# Patient Record
Sex: Female | Born: 1960 | Race: White | Hispanic: No | Marital: Single | Smoking: Never smoker
Health system: Southern US, Community
[De-identification: ages and names within clinical notes are randomized; demographics above are authoritative.]

## PROBLEM LIST (undated history)

## (undated) DIAGNOSIS — N289 Disorder of kidney and ureter, unspecified: Secondary | ICD-10-CM

## (undated) HISTORY — PX: APPENDECTOMY: SHX54

---

## 1997-03-20 ENCOUNTER — Encounter: Admission: RE | Admit: 1997-03-20 | Discharge: 1997-06-18 | Payer: Self-pay | Admitting: Obstetrics & Gynecology

## 1997-07-06 ENCOUNTER — Encounter: Admission: RE | Admit: 1997-07-06 | Discharge: 1997-10-04 | Payer: Self-pay | Admitting: *Deleted

## 1997-08-08 ENCOUNTER — Other Ambulatory Visit: Admission: RE | Admit: 1997-08-08 | Discharge: 1997-08-08 | Payer: Self-pay | Admitting: Obstetrics & Gynecology

## 1998-02-09 ENCOUNTER — Observation Stay (HOSPITAL_COMMUNITY): Admission: AD | Admit: 1998-02-09 | Discharge: 1998-02-10 | Payer: Self-pay | Admitting: Obstetrics and Gynecology

## 1998-02-09 ENCOUNTER — Encounter: Payer: Self-pay | Admitting: Obstetrics and Gynecology

## 1998-02-22 ENCOUNTER — Inpatient Hospital Stay (HOSPITAL_COMMUNITY): Admission: AD | Admit: 1998-02-22 | Discharge: 1998-03-10 | Payer: Self-pay | Admitting: Obstetrics and Gynecology

## 1998-02-26 ENCOUNTER — Encounter: Payer: Self-pay | Admitting: Obstetrics and Gynecology

## 1998-03-01 ENCOUNTER — Encounter: Payer: Self-pay | Admitting: Obstetrics and Gynecology

## 1998-03-03 ENCOUNTER — Encounter: Payer: Self-pay | Admitting: Obstetrics and Gynecology

## 1998-03-05 ENCOUNTER — Encounter: Payer: Self-pay | Admitting: Obstetrics and Gynecology

## 1998-03-09 ENCOUNTER — Encounter: Payer: Self-pay | Admitting: Obstetrics and Gynecology

## 1998-04-15 ENCOUNTER — Encounter: Payer: Self-pay | Admitting: Obstetrics and Gynecology

## 1998-04-15 ENCOUNTER — Inpatient Hospital Stay (HOSPITAL_COMMUNITY): Admission: AD | Admit: 1998-04-15 | Discharge: 1998-04-15 | Payer: Self-pay | Admitting: Obstetrics and Gynecology

## 1998-07-07 ENCOUNTER — Inpatient Hospital Stay (HOSPITAL_COMMUNITY): Admission: AD | Admit: 1998-07-07 | Discharge: 1998-07-09 | Payer: Self-pay | Admitting: Obstetrics and Gynecology

## 1998-08-28 ENCOUNTER — Other Ambulatory Visit: Admission: RE | Admit: 1998-08-28 | Discharge: 1998-08-28 | Payer: Self-pay | Admitting: Obstetrics & Gynecology

## 1999-05-15 ENCOUNTER — Other Ambulatory Visit: Admission: RE | Admit: 1999-05-15 | Discharge: 1999-05-15 | Payer: Self-pay | Admitting: Obstetrics & Gynecology

## 1999-05-16 ENCOUNTER — Ambulatory Visit (HOSPITAL_COMMUNITY): Admission: RE | Admit: 1999-05-16 | Discharge: 1999-05-16 | Payer: Self-pay | Admitting: Obstetrics & Gynecology

## 2000-01-16 ENCOUNTER — Ambulatory Visit (HOSPITAL_COMMUNITY): Admission: AD | Admit: 2000-01-16 | Discharge: 2000-01-16 | Payer: Self-pay | Admitting: Obstetrics & Gynecology

## 2000-04-10 ENCOUNTER — Other Ambulatory Visit: Admission: RE | Admit: 2000-04-10 | Discharge: 2000-04-10 | Payer: Self-pay | Admitting: Family Medicine

## 2000-04-10 ENCOUNTER — Other Ambulatory Visit: Admission: RE | Admit: 2000-04-10 | Discharge: 2000-04-10 | Payer: Self-pay | Admitting: Obstetrics & Gynecology

## 2000-10-15 ENCOUNTER — Inpatient Hospital Stay (HOSPITAL_COMMUNITY): Admission: AD | Admit: 2000-10-15 | Discharge: 2000-10-17 | Payer: Self-pay | Admitting: Obstetrics & Gynecology

## 2000-11-18 ENCOUNTER — Other Ambulatory Visit: Admission: RE | Admit: 2000-11-18 | Discharge: 2000-11-18 | Payer: Self-pay | Admitting: Obstetrics & Gynecology

## 2001-11-23 ENCOUNTER — Other Ambulatory Visit: Admission: RE | Admit: 2001-11-23 | Discharge: 2001-11-23 | Payer: Self-pay | Admitting: Obstetrics & Gynecology

## 2003-01-19 ENCOUNTER — Other Ambulatory Visit: Admission: RE | Admit: 2003-01-19 | Discharge: 2003-01-19 | Payer: Self-pay | Admitting: Obstetrics & Gynecology

## 2003-04-20 ENCOUNTER — Other Ambulatory Visit: Admission: RE | Admit: 2003-04-20 | Discharge: 2003-04-20 | Payer: Self-pay | Admitting: Obstetrics & Gynecology

## 2003-11-07 ENCOUNTER — Other Ambulatory Visit: Admission: RE | Admit: 2003-11-07 | Discharge: 2003-11-07 | Payer: Self-pay | Admitting: Obstetrics & Gynecology

## 2004-04-10 ENCOUNTER — Other Ambulatory Visit: Admission: RE | Admit: 2004-04-10 | Discharge: 2004-04-10 | Payer: Self-pay | Admitting: Obstetrics & Gynecology

## 2005-04-23 ENCOUNTER — Other Ambulatory Visit: Admission: RE | Admit: 2005-04-23 | Discharge: 2005-04-23 | Payer: Self-pay | Admitting: Obstetrics & Gynecology

## 2006-06-04 ENCOUNTER — Encounter: Admission: RE | Admit: 2006-06-04 | Discharge: 2006-06-04 | Payer: Self-pay | Admitting: Obstetrics & Gynecology

## 2012-03-25 ENCOUNTER — Emergency Department (HOSPITAL_COMMUNITY): Payer: BC Managed Care – PPO

## 2012-03-25 ENCOUNTER — Encounter (HOSPITAL_COMMUNITY): Payer: Self-pay | Admitting: *Deleted

## 2012-03-25 ENCOUNTER — Emergency Department (HOSPITAL_COMMUNITY)
Admission: EM | Admit: 2012-03-25 | Discharge: 2012-03-25 | Disposition: A | Discharge status: Home / Self Care | Payer: BC Managed Care – PPO | Attending: Emergency Medicine | Admitting: Emergency Medicine

## 2012-03-25 DIAGNOSIS — N200 Calculus of kidney: Secondary | ICD-10-CM

## 2012-03-25 DIAGNOSIS — R350 Frequency of micturition: Secondary | ICD-10-CM | POA: Insufficient documentation

## 2012-03-25 DIAGNOSIS — R109 Unspecified abdominal pain: Secondary | ICD-10-CM | POA: Insufficient documentation

## 2012-03-25 DIAGNOSIS — Z79899 Other long term (current) drug therapy: Secondary | ICD-10-CM | POA: Insufficient documentation

## 2012-03-25 DIAGNOSIS — Z7982 Long term (current) use of aspirin: Secondary | ICD-10-CM | POA: Insufficient documentation

## 2012-03-25 DIAGNOSIS — R112 Nausea with vomiting, unspecified: Secondary | ICD-10-CM | POA: Insufficient documentation

## 2012-03-25 HISTORY — DX: Disorder of kidney and ureter, unspecified: N28.9

## 2012-03-25 LAB — URINALYSIS, ROUTINE W REFLEX MICROSCOPIC
Ketones, ur: NEGATIVE mg/dL
Nitrite: NEGATIVE
Protein, ur: 30 mg/dL — AB
Urobilinogen, UA: 1 mg/dL (ref 0.0–1.0)

## 2012-03-25 MED ORDER — HYDROMORPHONE HCL PF 1 MG/ML IJ SOLN: 1.0000 mg | Freq: Once | INTRAMUSCULAR | Status: DC

## 2012-03-25 MED ORDER — ONDANSETRON HCL 4 MG/2ML IJ SOLN
4.0000 mg | Freq: Once | INTRAMUSCULAR | Status: AC
  Administered 2012-03-25: 4 mg via INTRAVENOUS
  Filled 2012-03-25: qty 2

## 2012-03-25 MED ORDER — FENTANYL CITRATE 0.05 MG/ML IJ SOLN
50.0000 ug | Freq: Once | INTRAMUSCULAR | Status: AC
  Administered 2012-03-25: 50 ug via INTRAVENOUS
  Filled 2012-03-25: qty 2

## 2012-03-25 MED ORDER — HYDROCODONE-ACETAMINOPHEN 5-325 MG PO TABS: ORAL_TABLET | ORAL | Status: DC

## 2012-03-25 MED ORDER — ONDANSETRON HCL 4 MG/2ML IJ SOLN: 4.0000 mg | Freq: Once | INTRAMUSCULAR | Status: DC

## 2012-03-25 NOTE — ED Provider Notes (Signed)
History     CSN: [id number]  Arrival date & time 03/25/12  1746   First MD Initiated Contact with Patient 03/25/12 1847      Chief Complaint  Patient presents with  . Nephrolithiasis    (Consider location/radiation/quality/duration/timing/severity/associated sxs/prior treatment) HPI  Patient states last night she started having frequency of urination and then about 4:30 this afternoon she started having left flank pain that has gotten very intense. She has had nausea and vomiting. She denies hematuria or fever. She states her current pain is a 10 out of 10. She states nothing she does makes the pain worse, nothing she does makes the pain feel better. She states she had similar pain about 7 years ago when she passed a kidney stone which she is been having since she was 52 years old. She states she called the urology office and spoke to Dr. Isabel Caprice who told her to come to the emergency room. She states she's never had to have any type of procedure to help her pass her stone.  PCP Dr. Martha Clan  Past Medical History  Diagnosis Date  . Renal disorder     kidney stone    History reviewed. No pertinent past surgical history.  History reviewed. No pertinent family history.  History  Substance Use Topics  . Smoking status: Never Smoker   . Smokeless tobacco: Not on file  . Alcohol Use: No  Lives at home Lives with spouse employed  OB History    Grav Para Term Preterm Abortions TAB SAB Ect Mult Living                  Review of Systems  All other systems reviewed and are negative.    Allergies  Sulfur  Home Medications   Current Outpatient Rx  Name  Route  Sig  Dispense  Refill  . ASPIRIN 81 MG PO TABS   Oral   Take 81 mg by mouth daily.         . ADULT MULTIVITAMIN W/MINERALS CH   Oral   Take 1 tablet by mouth daily.         Marland Kitchen NORGESTREL-ETHINYL ESTRADIOL 0.3-30 MG-MCG PO TABS   Oral   Take 1 tablet by mouth daily.           BP 118/40  Pulse 68   Temp 98.3 F (36.8 C) (Oral)  Resp 16  SpO2 100%  Vital signs normal    Physical Exam  Nursing note and vitals reviewed. Constitutional: She is oriented to person, place, and time. She appears well-developed and well-nourished.  Non-toxic appearance. She does not appear ill. She appears distressed.  HENT:  Head: Normocephalic and atraumatic.  Right Ear: External ear normal.  Left Ear: External ear normal.  Nose: Nose normal. No mucosal edema or rhinorrhea.  Mouth/Throat: Oropharynx is clear and moist and mucous membranes are normal. No dental abscesses or uvula swelling.  Eyes: Conjunctivae normal and EOM are normal. Pupils are equal, round, and reactive to light.  Neck: Normal range of motion and full passive range of motion without pain. Neck supple.  Cardiovascular: Normal rate, regular rhythm and normal heart sounds.  Exam reveals no gallop and no friction rub.   No murmur heard. Pulmonary/Chest: Effort normal and breath sounds normal. No respiratory distress. She has no wheezes. She has no rhonchi. She has no rales. She exhibits no tenderness and no crepitus.  Abdominal: Soft. Normal appearance and bowel sounds are normal. She exhibits no  distension. There is no tenderness. There is no rebound and no guarding.  Genitourinary:       Mild left flank tenderness to palpation  Musculoskeletal: Normal range of motion. She exhibits no edema and no tenderness.       Moves all extremities well.   Neurological: She is alert and oriented to person, place, and time. She has normal strength. No cranial nerve deficit.  Skin: Skin is warm, dry and intact. No rash noted. No erythema. No pallor.  Psychiatric: She has a normal mood and affect. Her speech is normal and behavior is normal. Her mood appears not anxious.    ED Course  Procedures (including critical care time)   Medications  ondansetron (ZOFRAN) injection 4 mg (not administered)  HYDROmorphone (DILAUDID) injection 1 mg (not  administered)  ondansetron (ZOFRAN) injection 4 mg (4 mg Intravenous Given 03/25/12 1906)  fentaNYL (SUBLIMAZE) injection 50 mcg (50 mcg Intravenous Given 03/25/12 1909)   Patient states her pain is basically gone, she has mild soreness in her left flank now. We discussed her CT results including that she still has bilateral renal stones, however she has already passed the stone that she was passing tonight.  Results for orders placed during the hospital encounter of 03/25/12  URINALYSIS, ROUTINE W REFLEX MICROSCOPIC      Component Value Range   Color, Urine YELLOW  YELLOW   APPearance CLOUDY (*) CLEAR   Specific Gravity, Urine 1.021  1.005 - 1.030   pH 8.0  5.0 - 8.0   Glucose, UA NEGATIVE  NEGATIVE mg/dL   Hgb urine dipstick MODERATE (*) NEGATIVE   Bilirubin Urine NEGATIVE  NEGATIVE   Ketones, ur NEGATIVE  NEGATIVE mg/dL   Protein, ur 30 (*) NEGATIVE mg/dL   Urobilinogen, UA 1.0  0.0 - 1.0 mg/dL   Nitrite NEGATIVE  NEGATIVE   Leukocytes, UA SMALL (*) NEGATIVE  URINE MICROSCOPIC-ADD ON      Component Value Range   Squamous Epithelial / LPF FEW (*) RARE   WBC, UA 0-2  <3 WBC/hpf   RBC / HPF 21-50  <3 RBC/hpf   Urine-Other AMORPHOUS URATES/PHOSPHATES     Laboratory interpretation all normal except hematuria   Ct Abdomen Pelvis Wo Contrast  03/25/2012  *RADIOLOGY REPORT*  Clinical Data: Left-sided flank pain, history of renal stones  CT ABDOMEN AND PELVIS WITHOUT CONTRAST  Technique:  Multidetector CT imaging of the abdomen and pelvis was performed following the standard protocol without intravenous contrast.  Comparison: None.  Findings:  The lack of intravenous contrast limits the ability to evaluate solid abdominal organs.  There is an approximately 5 mm opacity lying within the dependent portion of the midline in the urinary bladder (image 64, series 2). This finding is associated with asymmetric left-sided residual ureterectasis and very mild pelvocaliectasis and is favored to represent  a recently passed a left-sided renal stone.  Multiple additional nonobstructing stones are seen bilaterally. Dominant nonobstructing stone within the inferior pole of the left kidney measures approximately 5 x 3 mm (image 29, series 2) while adjacent approximately 2 and 3 mm stones are also seen within the inferior pole left kidney.  There are two punctate (2-3 mm) nonobstructing stones within the mid aspect of the right kidney (images 21 and 25).  No right-sided urinary obstruction no perinephric stranding.  Normal noncontrast appearance of the bilateral adrenal glands, pancreas and spleen.  Colonic diverticulosis without evidence of diverticulitis.  The transverse colon is noted to be redundant and low-lying.  Normal appearance of the appendix.  No evidence of enteric obstruction. No pneumoperitoneum, pneumatosis or portal venous gas.  Normal caliber of the abdominal aorta.  No definite retroperitoneal, mesenteric, pelvic or inguinal lymphadenopathy on this noncontrast examination.  Normal noncontrast appearance of the pelvic organs.  No discrete adnexal lesion.  No free fluid in the pelvis.  Limited visualization of the lower thorax demonstrates minimal left basilar subsegmental ground-glass atelectasis.  No focal airspace opacity.  No pleural effusion  Normal heart size.  No pericardial effusion.  No acute or aggressive osseous abnormalities.  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  IMPRESSION: 1.  Recently passed approximally 5 mm left sided renal stone is seen within the urinary bladder with associated mild residual asymmetric left-sided ureterectasis and pelvocaliectasis. 2.  Bilateral nonobstructing renal stone burden as above, left greater than right. 3.  Colonic diverticulosis without evidence of diverticulitis.  4.  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   Original Report Authenticated By: Tacey Ruiz, MD      1. Renal stone      New Prescriptions   HYDROCODONE-ACETAMINOPHEN (NORCO/VICODIN) 5-325 MG PER TABLET    Take 1 or 2 po Q 6hrs for pain    Plan discharge  Devoria Albe, MD, Armando Gang   MDM          Ward Givens, MD 03/25/12 2255

## 2012-03-25 NOTE — ED Notes (Signed)
Pt reports hx of kidney stones. Pt reports dysuria and left sided flank pain. Pt hunched over in pain. Pain started this afternoon 9/10.

## 2012-03-25 NOTE — ED Notes (Signed)
Patient transported to CT 

## 2012-04-20 ENCOUNTER — Other Ambulatory Visit: Payer: Self-pay | Admitting: Obstetrics & Gynecology

## 2012-04-30 ENCOUNTER — Ambulatory Visit
Admission: RE | Admit: 2012-04-30 | Discharge: 2012-04-30 | Disposition: A | Discharge status: Home / Self Care | Payer: BC Managed Care – PPO | Source: Ambulatory Visit | Attending: Obstetrics & Gynecology | Admitting: Obstetrics & Gynecology

## 2015-05-23 DIAGNOSIS — L821 Other seborrheic keratosis: Secondary | ICD-10-CM | POA: Diagnosis not present

## 2015-05-23 DIAGNOSIS — L570 Actinic keratosis: Secondary | ICD-10-CM | POA: Diagnosis not present

## 2015-05-23 DIAGNOSIS — D225 Melanocytic nevi of trunk: Secondary | ICD-10-CM | POA: Diagnosis not present

## 2015-05-23 DIAGNOSIS — L814 Other melanin hyperpigmentation: Secondary | ICD-10-CM | POA: Diagnosis not present

## 2015-06-26 DIAGNOSIS — H11002 Unspecified pterygium of left eye: Secondary | ICD-10-CM | POA: Diagnosis not present

## 2015-06-26 DIAGNOSIS — H18452 Nodular corneal degeneration, left eye: Secondary | ICD-10-CM | POA: Diagnosis not present

## 2015-06-26 DIAGNOSIS — H18451 Nodular corneal degeneration, right eye: Secondary | ICD-10-CM | POA: Diagnosis not present

## 2015-10-03 DIAGNOSIS — Z01419 Encounter for gynecological examination (general) (routine) without abnormal findings: Secondary | ICD-10-CM | POA: Diagnosis not present

## 2015-10-03 DIAGNOSIS — Z1231 Encounter for screening mammogram for malignant neoplasm of breast: Secondary | ICD-10-CM | POA: Diagnosis not present

## 2015-10-03 DIAGNOSIS — Z6824 Body mass index (BMI) 24.0-24.9, adult: Secondary | ICD-10-CM | POA: Diagnosis not present

## 2016-01-02 DIAGNOSIS — R8299 Other abnormal findings in urine: Secondary | ICD-10-CM | POA: Diagnosis not present

## 2016-01-02 DIAGNOSIS — E784 Other hyperlipidemia: Secondary | ICD-10-CM | POA: Diagnosis not present

## 2016-01-02 DIAGNOSIS — M859 Disorder of bone density and structure, unspecified: Secondary | ICD-10-CM | POA: Diagnosis not present

## 2016-01-09 DIAGNOSIS — R5383 Other fatigue: Secondary | ICD-10-CM | POA: Diagnosis not present

## 2016-01-09 DIAGNOSIS — Z0000 Encounter for general adult medical examination without abnormal findings: Secondary | ICD-10-CM | POA: Diagnosis not present

## 2016-01-09 DIAGNOSIS — E784 Other hyperlipidemia: Secondary | ICD-10-CM | POA: Diagnosis not present

## 2016-01-09 DIAGNOSIS — Z8249 Family history of ischemic heart disease and other diseases of the circulatory system: Secondary | ICD-10-CM | POA: Diagnosis not present

## 2016-01-09 DIAGNOSIS — Z1389 Encounter for screening for other disorder: Secondary | ICD-10-CM | POA: Diagnosis not present

## 2016-01-09 DIAGNOSIS — M859 Disorder of bone density and structure, unspecified: Secondary | ICD-10-CM | POA: Diagnosis not present

## 2016-01-25 DIAGNOSIS — Z1212 Encounter for screening for malignant neoplasm of rectum: Secondary | ICD-10-CM | POA: Diagnosis not present

## 2016-03-28 DIAGNOSIS — J111 Influenza due to unidentified influenza virus with other respiratory manifestations: Secondary | ICD-10-CM | POA: Diagnosis not present

## 2016-05-13 DIAGNOSIS — L821 Other seborrheic keratosis: Secondary | ICD-10-CM | POA: Diagnosis not present

## 2016-05-13 DIAGNOSIS — L570 Actinic keratosis: Secondary | ICD-10-CM | POA: Diagnosis not present

## 2016-05-13 DIAGNOSIS — L814 Other melanin hyperpigmentation: Secondary | ICD-10-CM | POA: Diagnosis not present

## 2016-06-25 DIAGNOSIS — H18451 Nodular corneal degeneration, right eye: Secondary | ICD-10-CM | POA: Diagnosis not present

## 2016-06-25 DIAGNOSIS — H18452 Nodular corneal degeneration, left eye: Secondary | ICD-10-CM | POA: Diagnosis not present

## 2016-06-25 DIAGNOSIS — H11002 Unspecified pterygium of left eye: Secondary | ICD-10-CM | POA: Diagnosis not present

## 2016-10-09 DIAGNOSIS — R87610 Atypical squamous cells of undetermined significance on cytologic smear of cervix (ASC-US): Secondary | ICD-10-CM | POA: Diagnosis not present

## 2016-10-09 DIAGNOSIS — Z13820 Encounter for screening for osteoporosis: Secondary | ICD-10-CM | POA: Diagnosis not present

## 2016-10-09 DIAGNOSIS — Z6820 Body mass index (BMI) 20.0-20.9, adult: Secondary | ICD-10-CM | POA: Diagnosis not present

## 2016-10-09 DIAGNOSIS — Z1231 Encounter for screening mammogram for malignant neoplasm of breast: Secondary | ICD-10-CM | POA: Diagnosis not present

## 2016-10-09 DIAGNOSIS — Z01419 Encounter for gynecological examination (general) (routine) without abnormal findings: Secondary | ICD-10-CM | POA: Diagnosis not present

## 2017-02-03 DIAGNOSIS — H903 Sensorineural hearing loss, bilateral: Secondary | ICD-10-CM | POA: Diagnosis not present

## 2017-02-19 DIAGNOSIS — G589 Mononeuropathy, unspecified: Secondary | ICD-10-CM | POA: Diagnosis not present

## 2017-02-19 DIAGNOSIS — H9193 Unspecified hearing loss, bilateral: Secondary | ICD-10-CM | POA: Diagnosis not present

## 2017-02-19 DIAGNOSIS — H9319 Tinnitus, unspecified ear: Secondary | ICD-10-CM | POA: Diagnosis not present

## 2017-02-19 DIAGNOSIS — Z6822 Body mass index (BMI) 22.0-22.9, adult: Secondary | ICD-10-CM | POA: Diagnosis not present

## 2017-03-17 DIAGNOSIS — H9313 Tinnitus, bilateral: Secondary | ICD-10-CM | POA: Diagnosis not present

## 2017-03-17 DIAGNOSIS — H903 Sensorineural hearing loss, bilateral: Secondary | ICD-10-CM | POA: Diagnosis not present

## 2017-03-25 DIAGNOSIS — N950 Postmenopausal bleeding: Secondary | ICD-10-CM | POA: Diagnosis not present

## 2017-04-08 DIAGNOSIS — R87615 Unsatisfactory cytologic smear of cervix: Secondary | ICD-10-CM | POA: Diagnosis not present

## 2017-04-08 DIAGNOSIS — N950 Postmenopausal bleeding: Secondary | ICD-10-CM | POA: Diagnosis not present

## 2017-04-08 DIAGNOSIS — R87610 Atypical squamous cells of undetermined significance on cytologic smear of cervix (ASC-US): Secondary | ICD-10-CM | POA: Diagnosis not present

## 2017-04-22 DIAGNOSIS — N950 Postmenopausal bleeding: Secondary | ICD-10-CM | POA: Diagnosis not present

## 2017-05-12 DIAGNOSIS — L814 Other melanin hyperpigmentation: Secondary | ICD-10-CM | POA: Diagnosis not present

## 2017-05-12 DIAGNOSIS — L738 Other specified follicular disorders: Secondary | ICD-10-CM | POA: Diagnosis not present

## 2017-05-12 DIAGNOSIS — L821 Other seborrheic keratosis: Secondary | ICD-10-CM | POA: Diagnosis not present

## 2017-05-12 DIAGNOSIS — L570 Actinic keratosis: Secondary | ICD-10-CM | POA: Diagnosis not present

## 2017-07-07 DIAGNOSIS — H11002 Unspecified pterygium of left eye: Secondary | ICD-10-CM | POA: Diagnosis not present

## 2017-07-07 DIAGNOSIS — H18452 Nodular corneal degeneration, left eye: Secondary | ICD-10-CM | POA: Diagnosis not present

## 2017-07-07 DIAGNOSIS — H11051 Peripheral pterygium, progressive, right eye: Secondary | ICD-10-CM | POA: Diagnosis not present

## 2017-07-07 DIAGNOSIS — Z9889 Other specified postprocedural states: Secondary | ICD-10-CM | POA: Diagnosis not present

## 2017-09-15 DIAGNOSIS — H903 Sensorineural hearing loss, bilateral: Secondary | ICD-10-CM | POA: Diagnosis not present

## 2017-09-15 DIAGNOSIS — H9313 Tinnitus, bilateral: Secondary | ICD-10-CM | POA: Diagnosis not present

## 2017-09-22 DIAGNOSIS — M859 Disorder of bone density and structure, unspecified: Secondary | ICD-10-CM | POA: Diagnosis not present

## 2017-09-22 DIAGNOSIS — R82998 Other abnormal findings in urine: Secondary | ICD-10-CM | POA: Diagnosis not present

## 2017-09-22 DIAGNOSIS — M8580 Other specified disorders of bone density and structure, unspecified site: Secondary | ICD-10-CM | POA: Diagnosis not present

## 2017-09-22 DIAGNOSIS — E7849 Other hyperlipidemia: Secondary | ICD-10-CM | POA: Diagnosis not present

## 2017-09-29 DIAGNOSIS — Z1389 Encounter for screening for other disorder: Secondary | ICD-10-CM | POA: Diagnosis not present

## 2017-09-29 DIAGNOSIS — H9319 Tinnitus, unspecified ear: Secondary | ICD-10-CM | POA: Diagnosis not present

## 2017-09-29 DIAGNOSIS — Z0000 Encounter for general adult medical examination without abnormal findings: Secondary | ICD-10-CM | POA: Diagnosis not present

## 2017-09-29 DIAGNOSIS — R5383 Other fatigue: Secondary | ICD-10-CM | POA: Diagnosis not present

## 2017-09-29 DIAGNOSIS — E7849 Other hyperlipidemia: Secondary | ICD-10-CM | POA: Diagnosis not present

## 2017-09-29 DIAGNOSIS — M8580 Other specified disorders of bone density and structure, unspecified site: Secondary | ICD-10-CM | POA: Diagnosis not present

## 2017-10-01 ENCOUNTER — Other Ambulatory Visit: Payer: Self-pay | Admitting: Internal Medicine

## 2017-10-01 DIAGNOSIS — Z8249 Family history of ischemic heart disease and other diseases of the circulatory system: Secondary | ICD-10-CM

## 2017-10-01 DIAGNOSIS — E785 Hyperlipidemia, unspecified: Secondary | ICD-10-CM

## 2017-10-02 DIAGNOSIS — Z1212 Encounter for screening for malignant neoplasm of rectum: Secondary | ICD-10-CM | POA: Diagnosis not present

## 2017-10-08 ENCOUNTER — Ambulatory Visit
Admission: RE | Admit: 2017-10-08 | Discharge: 2017-10-08 | Disposition: A | Discharge status: Home / Self Care | Payer: No Typology Code available for payment source | Source: Ambulatory Visit | Attending: Internal Medicine | Admitting: Internal Medicine

## 2017-10-08 DIAGNOSIS — Z8249 Family history of ischemic heart disease and other diseases of the circulatory system: Secondary | ICD-10-CM

## 2017-10-08 DIAGNOSIS — E785 Hyperlipidemia, unspecified: Secondary | ICD-10-CM

## 2017-10-15 DIAGNOSIS — Z01419 Encounter for gynecological examination (general) (routine) without abnormal findings: Secondary | ICD-10-CM | POA: Diagnosis not present

## 2017-10-15 DIAGNOSIS — Z1231 Encounter for screening mammogram for malignant neoplasm of breast: Secondary | ICD-10-CM | POA: Diagnosis not present

## 2017-10-15 DIAGNOSIS — Z6822 Body mass index (BMI) 22.0-22.9, adult: Secondary | ICD-10-CM | POA: Diagnosis not present

## 2018-05-17 DIAGNOSIS — R87610 Atypical squamous cells of undetermined significance on cytologic smear of cervix (ASC-US): Secondary | ICD-10-CM | POA: Diagnosis not present

## 2018-05-18 DIAGNOSIS — D225 Melanocytic nevi of trunk: Secondary | ICD-10-CM | POA: Diagnosis not present

## 2018-05-18 DIAGNOSIS — L821 Other seborrheic keratosis: Secondary | ICD-10-CM | POA: Diagnosis not present

## 2018-05-18 DIAGNOSIS — D2261 Melanocytic nevi of right upper limb, including shoulder: Secondary | ICD-10-CM | POA: Diagnosis not present

## 2018-05-18 DIAGNOSIS — L570 Actinic keratosis: Secondary | ICD-10-CM | POA: Diagnosis not present

## 2018-06-22 DIAGNOSIS — N950 Postmenopausal bleeding: Secondary | ICD-10-CM | POA: Diagnosis not present

## 2018-09-08 DIAGNOSIS — H18452 Nodular corneal degeneration, left eye: Secondary | ICD-10-CM | POA: Diagnosis not present

## 2018-09-08 DIAGNOSIS — H11051 Peripheral pterygium, progressive, right eye: Secondary | ICD-10-CM | POA: Diagnosis not present

## 2018-09-08 DIAGNOSIS — H11002 Unspecified pterygium of left eye: Secondary | ICD-10-CM | POA: Diagnosis not present

## 2018-09-15 DIAGNOSIS — H903 Sensorineural hearing loss, bilateral: Secondary | ICD-10-CM | POA: Diagnosis not present

## 2018-09-15 DIAGNOSIS — H9313 Tinnitus, bilateral: Secondary | ICD-10-CM | POA: Diagnosis not present

## 2018-10-19 DIAGNOSIS — Z01419 Encounter for gynecological examination (general) (routine) without abnormal findings: Secondary | ICD-10-CM | POA: Diagnosis not present

## 2018-10-19 DIAGNOSIS — Z6822 Body mass index (BMI) 22.0-22.9, adult: Secondary | ICD-10-CM | POA: Diagnosis not present

## 2018-10-19 DIAGNOSIS — Z1231 Encounter for screening mammogram for malignant neoplasm of breast: Secondary | ICD-10-CM | POA: Diagnosis not present

## 2019-01-21 DIAGNOSIS — R350 Frequency of micturition: Secondary | ICD-10-CM | POA: Diagnosis not present

## 2019-01-21 DIAGNOSIS — R300 Dysuria: Secondary | ICD-10-CM | POA: Diagnosis not present

## 2019-01-21 DIAGNOSIS — R102 Pelvic and perineal pain: Secondary | ICD-10-CM | POA: Diagnosis not present

## 2019-01-21 DIAGNOSIS — R109 Unspecified abdominal pain: Secondary | ICD-10-CM | POA: Diagnosis not present

## 2019-01-27 DIAGNOSIS — R102 Pelvic and perineal pain: Secondary | ICD-10-CM | POA: Diagnosis not present

## 2019-03-02 DIAGNOSIS — L308 Other specified dermatitis: Secondary | ICD-10-CM | POA: Diagnosis not present

## 2019-03-02 DIAGNOSIS — L570 Actinic keratosis: Secondary | ICD-10-CM | POA: Diagnosis not present

## 2019-04-14 DIAGNOSIS — M9901 Segmental and somatic dysfunction of cervical region: Secondary | ICD-10-CM | POA: Diagnosis not present

## 2019-04-14 DIAGNOSIS — M50322 Other cervical disc degeneration at C5-C6 level: Secondary | ICD-10-CM | POA: Diagnosis not present

## 2019-04-14 DIAGNOSIS — M9905 Segmental and somatic dysfunction of pelvic region: Secondary | ICD-10-CM | POA: Diagnosis not present

## 2019-04-14 DIAGNOSIS — Q72812 Congenital shortening of left lower limb: Secondary | ICD-10-CM | POA: Diagnosis not present

## 2019-04-18 DIAGNOSIS — M9905 Segmental and somatic dysfunction of pelvic region: Secondary | ICD-10-CM | POA: Diagnosis not present

## 2019-04-18 DIAGNOSIS — Q72812 Congenital shortening of left lower limb: Secondary | ICD-10-CM | POA: Diagnosis not present

## 2019-04-18 DIAGNOSIS — M9901 Segmental and somatic dysfunction of cervical region: Secondary | ICD-10-CM | POA: Diagnosis not present

## 2019-04-18 DIAGNOSIS — M50322 Other cervical disc degeneration at C5-C6 level: Secondary | ICD-10-CM | POA: Diagnosis not present

## 2019-04-19 DIAGNOSIS — Q72812 Congenital shortening of left lower limb: Secondary | ICD-10-CM | POA: Diagnosis not present

## 2019-04-19 DIAGNOSIS — M9905 Segmental and somatic dysfunction of pelvic region: Secondary | ICD-10-CM | POA: Diagnosis not present

## 2019-04-19 DIAGNOSIS — M9901 Segmental and somatic dysfunction of cervical region: Secondary | ICD-10-CM | POA: Diagnosis not present

## 2019-04-19 DIAGNOSIS — M50322 Other cervical disc degeneration at C5-C6 level: Secondary | ICD-10-CM | POA: Diagnosis not present

## 2019-04-21 DIAGNOSIS — M9905 Segmental and somatic dysfunction of pelvic region: Secondary | ICD-10-CM | POA: Diagnosis not present

## 2019-04-21 DIAGNOSIS — M50322 Other cervical disc degeneration at C5-C6 level: Secondary | ICD-10-CM | POA: Diagnosis not present

## 2019-04-21 DIAGNOSIS — Q72812 Congenital shortening of left lower limb: Secondary | ICD-10-CM | POA: Diagnosis not present

## 2019-04-21 DIAGNOSIS — M9901 Segmental and somatic dysfunction of cervical region: Secondary | ICD-10-CM | POA: Diagnosis not present

## 2019-04-25 DIAGNOSIS — M9905 Segmental and somatic dysfunction of pelvic region: Secondary | ICD-10-CM | POA: Diagnosis not present

## 2019-04-25 DIAGNOSIS — Q72812 Congenital shortening of left lower limb: Secondary | ICD-10-CM | POA: Diagnosis not present

## 2019-04-25 DIAGNOSIS — M9901 Segmental and somatic dysfunction of cervical region: Secondary | ICD-10-CM | POA: Diagnosis not present

## 2019-04-25 DIAGNOSIS — M50322 Other cervical disc degeneration at C5-C6 level: Secondary | ICD-10-CM | POA: Diagnosis not present

## 2019-04-26 DIAGNOSIS — Q72812 Congenital shortening of left lower limb: Secondary | ICD-10-CM | POA: Diagnosis not present

## 2019-04-26 DIAGNOSIS — M9905 Segmental and somatic dysfunction of pelvic region: Secondary | ICD-10-CM | POA: Diagnosis not present

## 2019-04-26 DIAGNOSIS — M9901 Segmental and somatic dysfunction of cervical region: Secondary | ICD-10-CM | POA: Diagnosis not present

## 2019-04-26 DIAGNOSIS — M50322 Other cervical disc degeneration at C5-C6 level: Secondary | ICD-10-CM | POA: Diagnosis not present

## 2019-04-28 DIAGNOSIS — Q72812 Congenital shortening of left lower limb: Secondary | ICD-10-CM | POA: Diagnosis not present

## 2019-04-28 DIAGNOSIS — M50322 Other cervical disc degeneration at C5-C6 level: Secondary | ICD-10-CM | POA: Diagnosis not present

## 2019-04-28 DIAGNOSIS — M9905 Segmental and somatic dysfunction of pelvic region: Secondary | ICD-10-CM | POA: Diagnosis not present

## 2019-04-28 DIAGNOSIS — M9901 Segmental and somatic dysfunction of cervical region: Secondary | ICD-10-CM | POA: Diagnosis not present

## 2019-05-02 DIAGNOSIS — M9901 Segmental and somatic dysfunction of cervical region: Secondary | ICD-10-CM | POA: Diagnosis not present

## 2019-05-02 DIAGNOSIS — Q72812 Congenital shortening of left lower limb: Secondary | ICD-10-CM | POA: Diagnosis not present

## 2019-05-02 DIAGNOSIS — M9905 Segmental and somatic dysfunction of pelvic region: Secondary | ICD-10-CM | POA: Diagnosis not present

## 2019-05-02 DIAGNOSIS — M50322 Other cervical disc degeneration at C5-C6 level: Secondary | ICD-10-CM | POA: Diagnosis not present

## 2019-05-03 DIAGNOSIS — Q72812 Congenital shortening of left lower limb: Secondary | ICD-10-CM | POA: Diagnosis not present

## 2019-05-03 DIAGNOSIS — M9905 Segmental and somatic dysfunction of pelvic region: Secondary | ICD-10-CM | POA: Diagnosis not present

## 2019-05-03 DIAGNOSIS — M9901 Segmental and somatic dysfunction of cervical region: Secondary | ICD-10-CM | POA: Diagnosis not present

## 2019-05-03 DIAGNOSIS — M50322 Other cervical disc degeneration at C5-C6 level: Secondary | ICD-10-CM | POA: Diagnosis not present

## 2019-05-05 DIAGNOSIS — M9905 Segmental and somatic dysfunction of pelvic region: Secondary | ICD-10-CM | POA: Diagnosis not present

## 2019-05-05 DIAGNOSIS — M9901 Segmental and somatic dysfunction of cervical region: Secondary | ICD-10-CM | POA: Diagnosis not present

## 2019-05-05 DIAGNOSIS — Q72812 Congenital shortening of left lower limb: Secondary | ICD-10-CM | POA: Diagnosis not present

## 2019-05-05 DIAGNOSIS — M50322 Other cervical disc degeneration at C5-C6 level: Secondary | ICD-10-CM | POA: Diagnosis not present

## 2019-05-09 DIAGNOSIS — M50322 Other cervical disc degeneration at C5-C6 level: Secondary | ICD-10-CM | POA: Diagnosis not present

## 2019-05-09 DIAGNOSIS — Q72812 Congenital shortening of left lower limb: Secondary | ICD-10-CM | POA: Diagnosis not present

## 2019-05-09 DIAGNOSIS — M9901 Segmental and somatic dysfunction of cervical region: Secondary | ICD-10-CM | POA: Diagnosis not present

## 2019-05-09 DIAGNOSIS — M9905 Segmental and somatic dysfunction of pelvic region: Secondary | ICD-10-CM | POA: Diagnosis not present

## 2019-05-10 DIAGNOSIS — M50322 Other cervical disc degeneration at C5-C6 level: Secondary | ICD-10-CM | POA: Diagnosis not present

## 2019-05-10 DIAGNOSIS — Q72812 Congenital shortening of left lower limb: Secondary | ICD-10-CM | POA: Diagnosis not present

## 2019-05-10 DIAGNOSIS — M9901 Segmental and somatic dysfunction of cervical region: Secondary | ICD-10-CM | POA: Diagnosis not present

## 2019-05-10 DIAGNOSIS — M9905 Segmental and somatic dysfunction of pelvic region: Secondary | ICD-10-CM | POA: Diagnosis not present

## 2019-05-11 ENCOUNTER — Ambulatory Visit: Payer: BC Managed Care – PPO | Attending: Internal Medicine

## 2019-05-11 DIAGNOSIS — Z20822 Contact with and (suspected) exposure to covid-19: Secondary | ICD-10-CM | POA: Diagnosis not present

## 2019-05-12 LAB — NOVEL CORONAVIRUS, NAA: SARS-CoV-2, NAA: NOT DETECTED

## 2019-05-12 LAB — SARS-COV-2, NAA 2 DAY TAT

## 2019-05-23 DIAGNOSIS — M50322 Other cervical disc degeneration at C5-C6 level: Secondary | ICD-10-CM | POA: Diagnosis not present

## 2019-05-23 DIAGNOSIS — M9905 Segmental and somatic dysfunction of pelvic region: Secondary | ICD-10-CM | POA: Diagnosis not present

## 2019-05-23 DIAGNOSIS — M9901 Segmental and somatic dysfunction of cervical region: Secondary | ICD-10-CM | POA: Diagnosis not present

## 2019-05-23 DIAGNOSIS — Q72812 Congenital shortening of left lower limb: Secondary | ICD-10-CM | POA: Diagnosis not present

## 2019-05-25 DIAGNOSIS — M9901 Segmental and somatic dysfunction of cervical region: Secondary | ICD-10-CM | POA: Diagnosis not present

## 2019-05-25 DIAGNOSIS — M9905 Segmental and somatic dysfunction of pelvic region: Secondary | ICD-10-CM | POA: Diagnosis not present

## 2019-05-25 DIAGNOSIS — Q72812 Congenital shortening of left lower limb: Secondary | ICD-10-CM | POA: Diagnosis not present

## 2019-05-25 DIAGNOSIS — M50322 Other cervical disc degeneration at C5-C6 level: Secondary | ICD-10-CM | POA: Diagnosis not present

## 2019-05-30 DIAGNOSIS — Q72812 Congenital shortening of left lower limb: Secondary | ICD-10-CM | POA: Diagnosis not present

## 2019-05-30 DIAGNOSIS — M9901 Segmental and somatic dysfunction of cervical region: Secondary | ICD-10-CM | POA: Diagnosis not present

## 2019-05-30 DIAGNOSIS — M9905 Segmental and somatic dysfunction of pelvic region: Secondary | ICD-10-CM | POA: Diagnosis not present

## 2019-05-30 DIAGNOSIS — M50322 Other cervical disc degeneration at C5-C6 level: Secondary | ICD-10-CM | POA: Diagnosis not present

## 2019-06-08 DIAGNOSIS — M9905 Segmental and somatic dysfunction of pelvic region: Secondary | ICD-10-CM | POA: Diagnosis not present

## 2019-06-08 DIAGNOSIS — Q72812 Congenital shortening of left lower limb: Secondary | ICD-10-CM | POA: Diagnosis not present

## 2019-06-08 DIAGNOSIS — M50322 Other cervical disc degeneration at C5-C6 level: Secondary | ICD-10-CM | POA: Diagnosis not present

## 2019-06-08 DIAGNOSIS — M9901 Segmental and somatic dysfunction of cervical region: Secondary | ICD-10-CM | POA: Diagnosis not present

## 2019-06-15 DIAGNOSIS — M9901 Segmental and somatic dysfunction of cervical region: Secondary | ICD-10-CM | POA: Diagnosis not present

## 2019-06-15 DIAGNOSIS — Q72812 Congenital shortening of left lower limb: Secondary | ICD-10-CM | POA: Diagnosis not present

## 2019-06-15 DIAGNOSIS — M9905 Segmental and somatic dysfunction of pelvic region: Secondary | ICD-10-CM | POA: Diagnosis not present

## 2019-06-15 DIAGNOSIS — D225 Melanocytic nevi of trunk: Secondary | ICD-10-CM | POA: Diagnosis not present

## 2019-06-15 DIAGNOSIS — M50322 Other cervical disc degeneration at C5-C6 level: Secondary | ICD-10-CM | POA: Diagnosis not present

## 2019-06-15 DIAGNOSIS — L814 Other melanin hyperpigmentation: Secondary | ICD-10-CM | POA: Diagnosis not present

## 2019-06-15 DIAGNOSIS — L570 Actinic keratosis: Secondary | ICD-10-CM | POA: Diagnosis not present

## 2019-07-13 DIAGNOSIS — J069 Acute upper respiratory infection, unspecified: Secondary | ICD-10-CM | POA: Diagnosis not present

## 2019-07-13 DIAGNOSIS — J309 Allergic rhinitis, unspecified: Secondary | ICD-10-CM | POA: Diagnosis not present

## 2019-09-09 DIAGNOSIS — Z20822 Contact with and (suspected) exposure to covid-19: Secondary | ICD-10-CM | POA: Diagnosis not present

## 2019-10-20 DIAGNOSIS — Z20828 Contact with and (suspected) exposure to other viral communicable diseases: Secondary | ICD-10-CM | POA: Diagnosis not present

## 2019-10-20 DIAGNOSIS — B349 Viral infection, unspecified: Secondary | ICD-10-CM | POA: Diagnosis not present

## 2019-10-20 DIAGNOSIS — R05 Cough: Secondary | ICD-10-CM | POA: Diagnosis not present

## 2019-11-08 IMAGING — CT CT HEART SCORING
2 of 3 series · 10 of 20 positions shown, 12 images · non-contrast
Comparison: None.

CLINICAL DATA: 56-year-old white female with hyperlipidemia. Family
history of early coronary artery disease.

EXAM:
CT HEART FOR CALCIUM SCORING
TECHNIQUE: CT heart was performed on a 64 channel system using prospective ECG
gating.
A non-contrast exam for calcium scoring was performed.
Note that this exam targets the heart and the chest was not imaged
in its entirety.

[Series 3: calcium scoring 2.00 br40 bestdiast 71% ax fov · axial · 0.27mm/px · z∈[+1514,+1606]mm · 5 of 70 slices shown, 7 images]
[im 12/70  vessel]
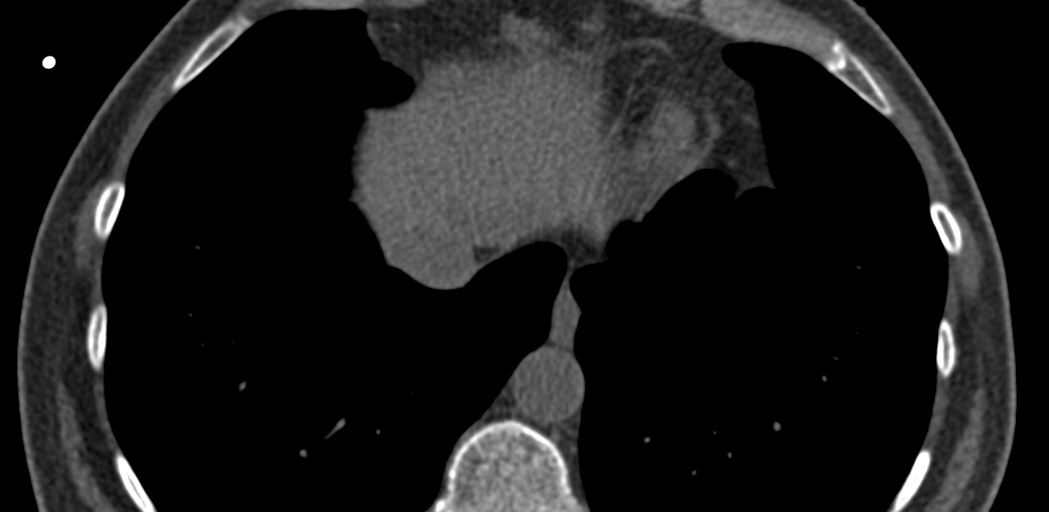
[im 12/70  lung]
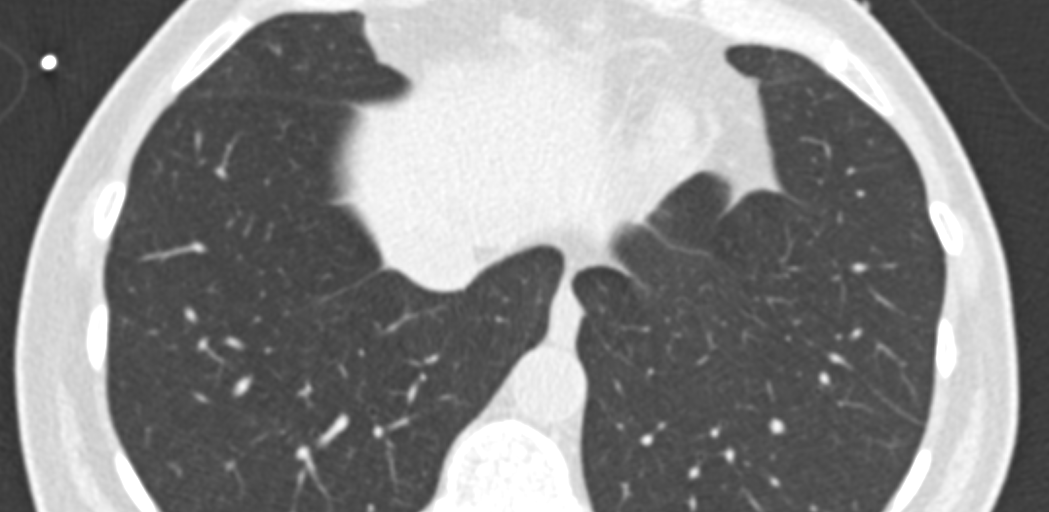
[im 24/70  vessel]
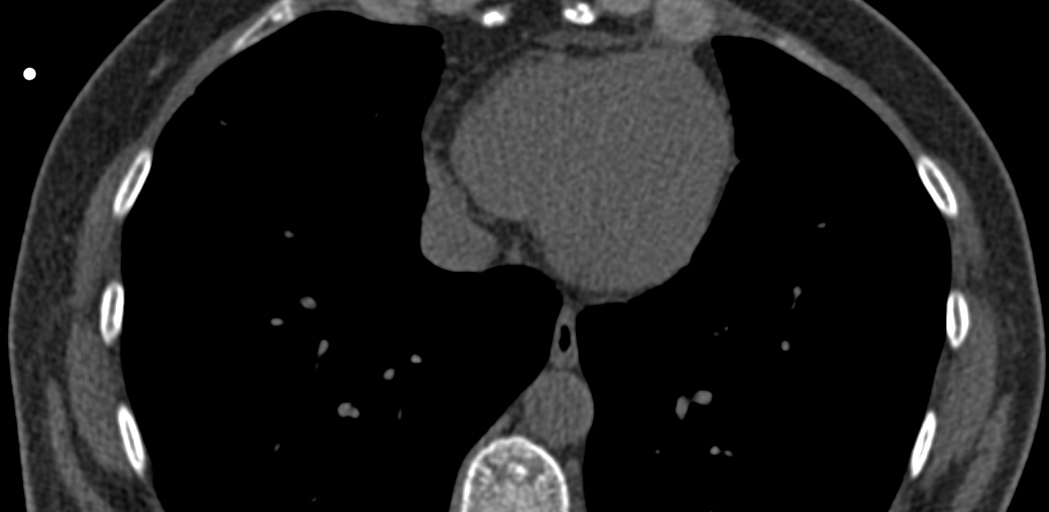
[im 35/70  vessel]
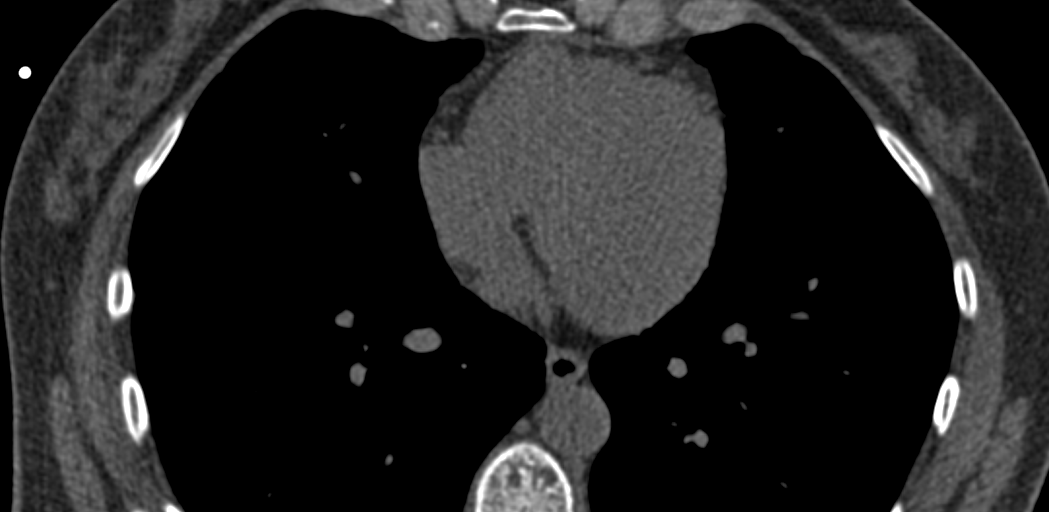
[im 47/70  vessel]
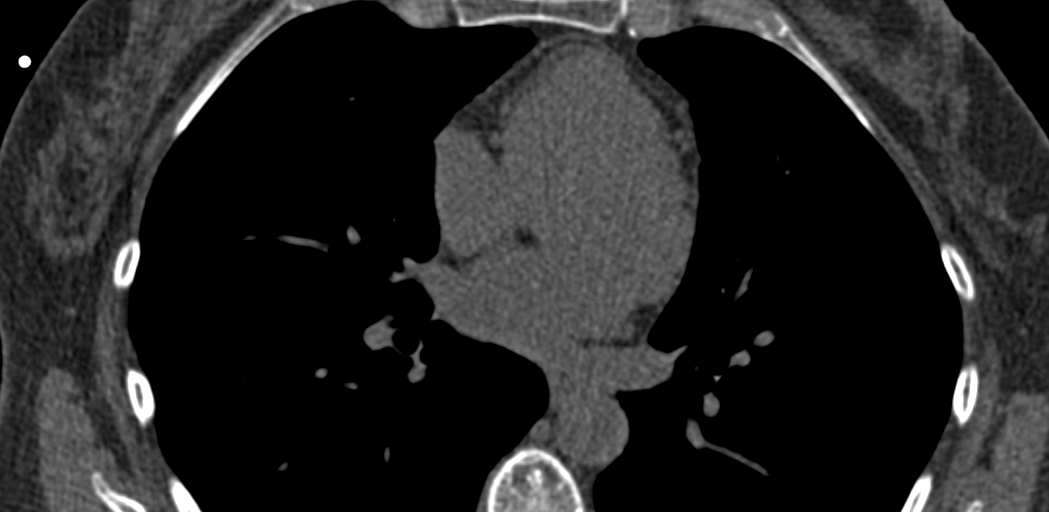
[im 58/70  vessel]
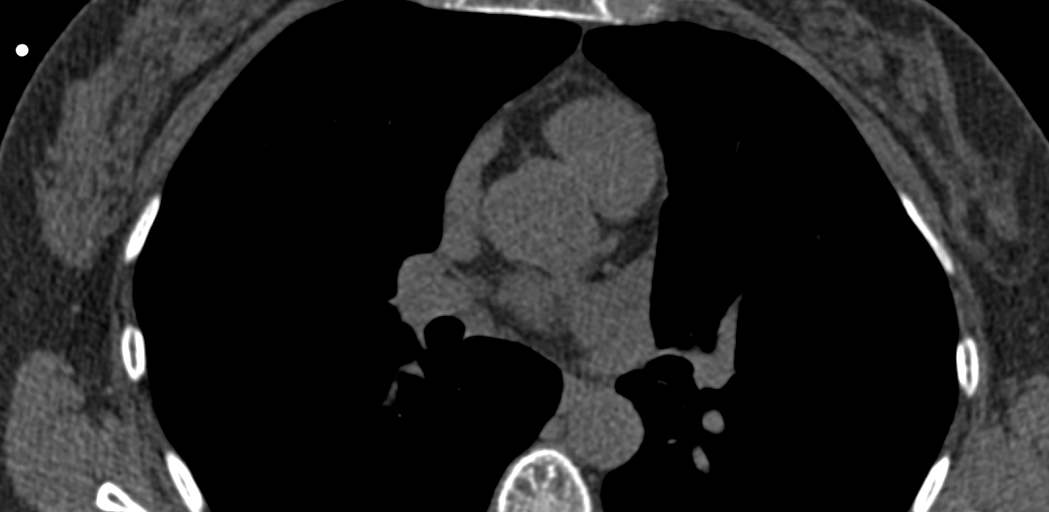
[im 58/70  lung]
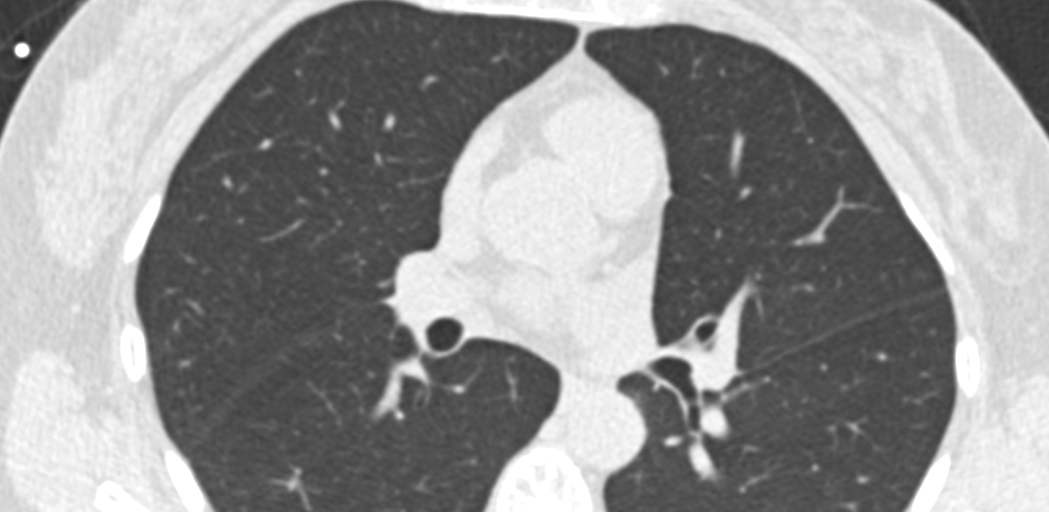

[Series 9: calcium scoring 2.00 br60 bestdiast 71% ax fov · axial · 0.45mm/px · z∈[+1516,+1606]mm · 5 of 69 slices shown]
[im 12/69  vessel]
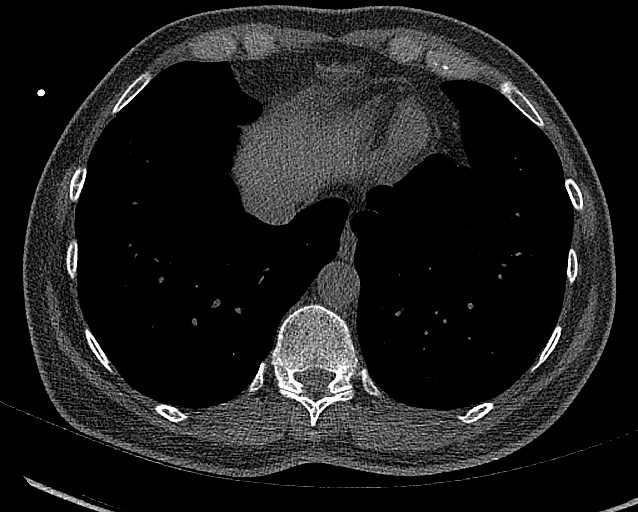
[im 23/69  vessel]
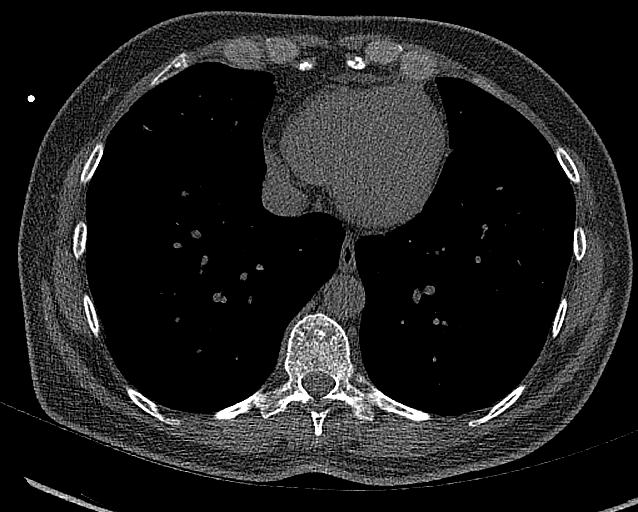
[im 35/69  vessel]
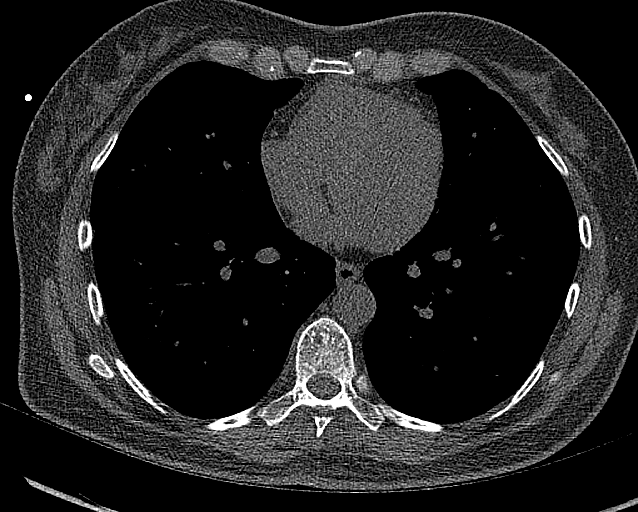
[im 46/69  vessel]
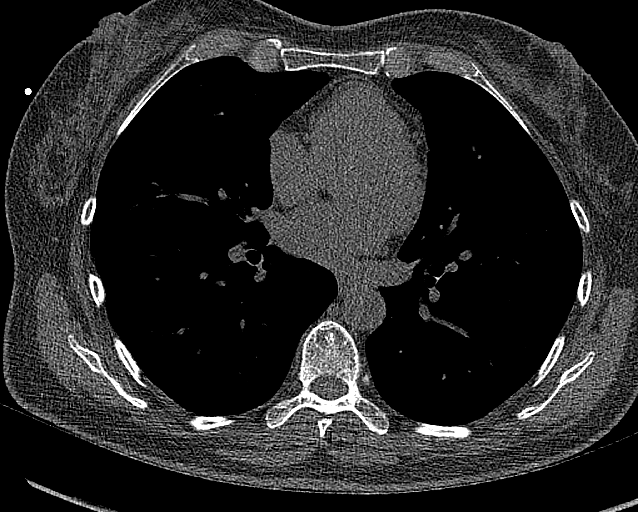
[im 57/69  vessel]
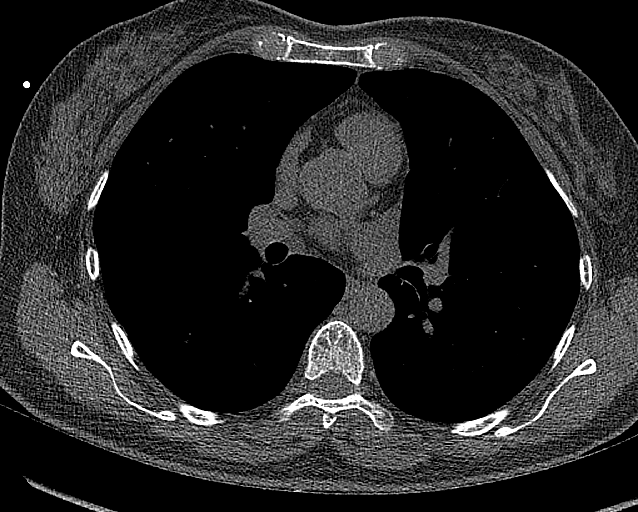

[10 of 20 positions shown; findings below may reference images not displayed]

FINDINGS: Technical quality: Good.

CORONARY CALCIUM

Total Agatston Score: 0

[HOSPITAL] percentile:  0

OTHER FINDINGS:

Cardiovascular: There are no coronary artery calcifications.
Visualized thoracic aorta has a normal caliber without
atherosclerotic calcifications. Heart size is normal. No significant
pericardial fluid.

Mediastinum/Nodes: Punctate calcification in the right hilum may be
related to old granulomatous disease. There are no enlarged lymph
nodes in the visualized mediastinum.

Lungs/Pleura: Visualized lungs are clear without pleural effusions.

Upper Abdomen: Visualized upper abdominal structures are
unremarkable.

Musculoskeletal: Unremarkable.
IMPRESSION: No coronary artery calcifications.  Coronary calcium score is 0.

## 2019-11-09 DIAGNOSIS — Z1231 Encounter for screening mammogram for malignant neoplasm of breast: Secondary | ICD-10-CM | POA: Diagnosis not present

## 2019-11-09 DIAGNOSIS — Z130 Encounter for screening for diseases of the blood and blood-forming organs and certain disorders involving the immune mechanism: Secondary | ICD-10-CM | POA: Diagnosis not present

## 2019-11-09 DIAGNOSIS — Z6820 Body mass index (BMI) 20.0-20.9, adult: Secondary | ICD-10-CM | POA: Diagnosis not present

## 2019-11-09 DIAGNOSIS — Z01419 Encounter for gynecological examination (general) (routine) without abnormal findings: Secondary | ICD-10-CM | POA: Diagnosis not present

## 2019-11-09 DIAGNOSIS — Z1329 Encounter for screening for other suspected endocrine disorder: Secondary | ICD-10-CM | POA: Diagnosis not present

## 2019-12-08 DIAGNOSIS — F419 Anxiety disorder, unspecified: Secondary | ICD-10-CM | POA: Diagnosis not present

## 2021-04-18 ENCOUNTER — Observation Stay (HOSPITAL_COMMUNITY)
Admission: EM | Admit: 2021-04-18 | Discharge: 2021-04-19 | Disposition: A | Discharge status: Home / Self Care | Payer: BC Managed Care – PPO | Attending: Internal Medicine | Admitting: Internal Medicine

## 2021-04-18 ENCOUNTER — Emergency Department (HOSPITAL_COMMUNITY): Payer: BC Managed Care – PPO

## 2021-04-18 ENCOUNTER — Other Ambulatory Visit: Payer: Self-pay

## 2021-04-18 DIAGNOSIS — R9389 Abnormal findings on diagnostic imaging of other specified body structures: Secondary | ICD-10-CM | POA: Diagnosis not present

## 2021-04-18 DIAGNOSIS — Z79899 Other long term (current) drug therapy: Secondary | ICD-10-CM | POA: Diagnosis not present

## 2021-04-18 DIAGNOSIS — R112 Nausea with vomiting, unspecified: Principal | ICD-10-CM | POA: Diagnosis present

## 2021-04-18 DIAGNOSIS — Z7982 Long term (current) use of aspirin: Secondary | ICD-10-CM | POA: Diagnosis not present

## 2021-04-18 DIAGNOSIS — E872 Acidosis, unspecified: Secondary | ICD-10-CM | POA: Diagnosis not present

## 2021-04-18 DIAGNOSIS — Z20822 Contact with and (suspected) exposure to covid-19: Secondary | ICD-10-CM | POA: Diagnosis not present

## 2021-04-18 DIAGNOSIS — Z87442 Personal history of urinary calculi: Secondary | ICD-10-CM | POA: Diagnosis not present

## 2021-04-18 DIAGNOSIS — R197 Diarrhea, unspecified: Secondary | ICD-10-CM | POA: Diagnosis not present

## 2021-04-18 DIAGNOSIS — E8729 Other acidosis: Secondary | ICD-10-CM

## 2021-04-18 DIAGNOSIS — E860 Dehydration: Secondary | ICD-10-CM | POA: Diagnosis not present

## 2021-04-18 LAB — COMPREHENSIVE METABOLIC PANEL
ALT: 19 U/L (ref 0–44)
ALT: 23 U/L (ref 0–44)
AST: 23 U/L (ref 15–41)
AST: 28 U/L (ref 15–41)
Albumin: 4.3 g/dL (ref 3.5–5.0)
Albumin: 4.8 g/dL (ref 3.5–5.0)
Alkaline Phosphatase: 83 U/L (ref 38–126)
Alkaline Phosphatase: 95 U/L (ref 38–126)
Anion gap: 19 — ABNORMAL HIGH (ref 5–15)
Anion gap: 20 — ABNORMAL HIGH (ref 5–15)
BUN: 21 mg/dL — ABNORMAL HIGH (ref 6–20)
BUN: 21 mg/dL — ABNORMAL HIGH (ref 6–20)
CO2: 18 mmol/L — ABNORMAL LOW (ref 22–32)
CO2: 19 mmol/L — ABNORMAL LOW (ref 22–32)
Calcium: 9.8 mg/dL (ref 8.9–10.3)
Calcium: 10.2 mg/dL (ref 8.9–10.3)
Chloride: 101 mmol/L (ref 98–111)
Chloride: 97 mmol/L — ABNORMAL LOW (ref 98–111)
Creatinine, Ser: 0.97 mg/dL (ref 0.44–1.00)
Creatinine, Ser: 0.95 mg/dL (ref 0.44–1.00)
GFR, Estimated: >60 mL/min (ref >60)
GFR, Estimated: >60 mL/min (ref >60)
Glucose, Bld: 85 mg/dL (ref 70–99)
Glucose, Bld: 67 mg/dL — ABNORMAL LOW (ref 70–99)
Potassium: 3.9 mmol/L (ref 3.5–5.1)
Potassium: 4.1 mmol/L (ref 3.5–5.1)
Sodium: 138 mmol/L (ref 135–145)
Sodium: 136 mmol/L (ref 135–145)
Total Bilirubin: 1.2 mg/dL (ref 0.3–1.2)
Total Bilirubin: 1.7 mg/dL — ABNORMAL HIGH (ref 0.3–1.2)
Total Protein: 8 g/dL (ref 6.5–8.1)
Total Protein: 8.5 g/dL — ABNORMAL HIGH (ref 6.5–8.1)

## 2021-04-18 LAB — URINALYSIS, ROUTINE W REFLEX MICROSCOPIC
Bilirubin Urine: NEGATIVE
Glucose, UA: NEGATIVE mg/dL
Ketones, ur: 80 mg/dL — AB
Leukocytes,Ua: NEGATIVE
Nitrite: NEGATIVE
Protein, ur: 30 mg/dL — AB
Specific Gravity, Urine: 1.023 (ref 1.005–1.030)
pH: 5 (ref 5.0–8.0)

## 2021-04-18 LAB — BLOOD GAS, VENOUS
Acid-base deficit: 1.9 mmol/L (ref 0.0–2.0)
Bicarbonate: 24.3 mmol/L (ref 20.0–28.0)
O2 Saturation: 21.3 %
Patient temperature: 37
pCO2, Ven: 45 mmHg (ref 44–60)
pH, Ven: 7.34 (ref 7.25–7.43)
pO2, Ven: <31 mmHg — CL (ref 32–45)

## 2021-04-18 LAB — CBC
HCT: 51.1 % — ABNORMAL HIGH (ref 36.0–46.0)
Hemoglobin: 16.4 g/dL — ABNORMAL HIGH (ref 12.0–15.0)
MCH: 30.7 pg (ref 26.0–34.0)
MCHC: 32.1 g/dL (ref 30.0–36.0)
MCV: 95.5 fL (ref 80.0–100.0)
Platelets: 419 10*3/uL — ABNORMAL HIGH (ref 150–400)
RBC: 5.35 MIL/uL — ABNORMAL HIGH (ref 3.87–5.11)
RDW: 12.5 % (ref 11.5–15.5)
WBC: 7.4 10*3/uL (ref 4.0–10.5)
nRBC: 0 % (ref 0.0–0.2)

## 2021-04-18 LAB — BETA-HYDROXYBUTYRIC ACID: Beta-Hydroxybutyric Acid: 4.96 mmol/L — ABNORMAL HIGH (ref 0.05–0.27)

## 2021-04-18 LAB — LIPASE, BLOOD: Lipase: 37 U/L (ref 11–51)

## 2021-04-18 LAB — MAGNESIUM: Magnesium: 2.4 mg/dL (ref 1.7–2.4)

## 2021-04-18 LAB — PHOSPHORUS: Phosphorus: 4.2 mg/dL (ref 2.5–4.6)

## 2021-04-18 LAB — LACTIC ACID, PLASMA: Lactic Acid, Venous: 1.8 mmol/L (ref 0.5–1.9)

## 2021-04-18 LAB — CK: Total CK: 49 U/L (ref 38–234)

## 2021-04-18 MED ORDER — SODIUM CHLORIDE 0.9 % IV BOLUS
1000.0000 mL | Freq: Once | INTRAVENOUS | Status: AC
  Administered 2021-04-18: 1000 mL via INTRAVENOUS

## 2021-04-18 MED ORDER — PROCHLORPERAZINE EDISYLATE 10 MG/2ML IJ SOLN
10.0000 mg | Freq: Once | INTRAMUSCULAR | Status: AC
  Administered 2021-04-18: 10 mg via INTRAVENOUS
  Filled 2021-04-18: qty 2

## 2021-04-18 MED ORDER — DEXTROSE 50 % IV SOLN
25.0000 mL | Freq: Once | INTRAVENOUS | Status: AC
  Administered 2021-04-18: 25 mL via INTRAVENOUS
  Filled 2021-04-18: qty 50

## 2021-04-18 MED ORDER — IOHEXOL 300 MG/ML  SOLN
100.0000 mL | Freq: Once | INTRAMUSCULAR | Status: AC | PRN
  Administered 2021-04-18: 100 mL via INTRAVENOUS

## 2021-04-18 MED ORDER — SODIUM CHLORIDE 0.9 % IV SOLN
25.0000 mg | Freq: Once | INTRAVENOUS | Status: AC
  Administered 2021-04-18: 25 mg via INTRAVENOUS
  Filled 2021-04-18: qty 1

## 2021-04-18 NOTE — ED Provider Triage Note (Signed)
Emergency Medicine Provider Triage Evaluation Note ? ?Judy Jimenez , a 61 y.o. female  was evaluated in triage.  Pt complains of nausea and vomiting for 5 days. 2-3 ep per day. No blood in emesis ? ?Review of Systems  ?Positive: Nausea vomiting ?Negative: Fever  ? ?Physical Exam  ?BP (!) 132/97 (BP Location: Right Arm)   Pulse (!) 102   Temp 98.6 ?F (37 ?C) (Oral)   Resp 14   Ht 5' (1.524 m)   Wt 54.4 kg   SpO2 99%   BMI 23.44 kg/m?  ?Gen:   Awake, no distress   ?Resp:  Normal effort  ?MSK:   Moves extremities without difficulty  ?Other:  Abd soft ? ?Medical Decision Making  ?Medically screening exam initiated at 12:02 PM.  Appropriate orders placed.  Judy Jimenez was informed that the remainder of the evaluation will be completed by another provider, this initial triage assessment does not replace that evaluation, and the importance of remaining in the ED until their evaluation is complete. ? ?Labs ?  ?Gailen Shelter, [place] ?04/18/21 1204 ? ?

## 2021-04-18 NOTE — ED Provider Notes (Signed)
[facility] EMERGENCY DEPARTMENT Provider Note   CSN: [id number] Arrival date & time: 04/18/21  1113     History  Chief Complaint  Patient presents with   Abdominal Pain   Emesis    Judy Jimenez is a 61 y.o. female.  The history is provided by the patient and medical records. No language interpreter was used.  Abdominal Pain Pain location:  Epigastric Pain quality: aching   Pain radiates to:  Does not radiate Pain severity:  Moderate Onset quality:  Gradual Duration:  5 days Timing:  Constant Progression:  Waxing and waning Chronicity:  New Context: sick contacts (coworkers with gi bugs) and suspicious food intake (salad)   Relieved by:  Nothing Worsened by:  Palpation and eating Ineffective treatments:  OTC medications Associated symptoms: constipation, diarrhea (esolved now), fatigue, nausea and vomiting   Associated symptoms: no chest pain, no chills, no cough, no dysuria, no fever, no hematemesis, no hematochezia, no melena and no shortness of breath   Emesis Severity:  Severe Duration:  5 days Timing:  Intermittent Quality:  Stomach contents Progression:  Unchanged Chronicity:  New Recent urination:  Normal Associated symptoms: abdominal pain and diarrhea (esolved now)   Associated symptoms: no chills, no cough, no fever and no headaches       Home Medications Prior to Admission medications   Medication Sig Start Date End Date Taking? Authorizing Provider  aspirin 81 MG tablet Take 81 mg by mouth daily.    [provider]  HYDROcodone-acetaminophen (NORCO/VICODIN) 5-325 MG per tablet Take 1 or 2 po Q 6hrs for pain 03/25/12   Devoria Albe, MD  Multiple Vitamin (MULTIVITAMIN WITH MINERALS) TABS Take 1 tablet by mouth daily.    [provider]  norgestrel-ethinyl estradiol (LO/OVRAL,CRYSELLE) 0.3-30 MG-MCG tablet Take 1 tablet by mouth daily.    [provider]      Allergies    Elemental sulfur    Review of  Systems   Review of Systems  Constitutional:  Positive for fatigue. Negative for chills, diaphoresis and fever.  HENT:  Negative for congestion.   Eyes:  Negative for visual disturbance.  Respiratory:  Negative for cough, chest tightness, shortness of breath, wheezing and stridor.   Cardiovascular:  Negative for chest pain, palpitations and leg swelling.  Gastrointestinal:  Positive for abdominal pain, constipation, diarrhea (esolved now), nausea and vomiting. Negative for hematemesis, hematochezia and melena.  Genitourinary:  Negative for dysuria.  Musculoskeletal:  Negative for back pain, neck pain and neck stiffness.  Skin:  Negative for rash and wound.  Neurological:  Negative for headaches.  Psychiatric/Behavioral:  Negative for agitation and confusion.   All other systems reviewed and are negative.  Physical Exam Updated Vital Signs BP 115/87    Pulse 92    Temp 98.1 F (36.7 C) (Oral)    Resp 16    Ht 5' (1.524 m)    Wt 54.4 kg    SpO2 100%    BMI 23.44 kg/m  Physical Exam Vitals and nursing note reviewed.  Constitutional:      General: She is not in acute distress.    Appearance: She is well-developed. She is not ill-appearing, toxic-appearing or diaphoretic.  HENT:     Head: Normocephalic and atraumatic.     Mouth/Throat:     Mouth: Mucous membranes are dry.     Pharynx: No oropharyngeal exudate or posterior oropharyngeal erythema.  Eyes:     Extraocular Movements: Extraocular movements intact.  Conjunctiva/sclera: Conjunctivae normal.     Pupils: Pupils are equal, round, and reactive to light.  Cardiovascular:     Rate and Rhythm: Normal rate and regular rhythm.     Heart sounds: No murmur heard. Pulmonary:     Effort: Pulmonary effort is normal. No respiratory distress.     Breath sounds: Normal breath sounds. No wheezing, rhonchi or rales.  Chest:     Chest wall: No tenderness.  Abdominal:     General: Abdomen is flat.     Palpations: Abdomen is soft.      Tenderness: There is abdominal tenderness. There is no right CVA tenderness, left CVA tenderness, guarding or rebound.  Musculoskeletal:        General: No swelling or tenderness.     Cervical back: Neck supple. No tenderness.     Right lower leg: No edema.     Left lower leg: No edema.  Skin:    General: Skin is warm and dry.     Capillary Refill: Capillary refill takes less than 2 seconds.     Findings: No erythema.  Neurological:     General: No focal deficit present.     Mental Status: She is alert.  Psychiatric:        Mood and Affect: Mood normal.    ED Results / Procedures / Treatments   Labs (all labs ordered are listed, but only abnormal results are displayed) Labs Reviewed  COMPREHENSIVE METABOLIC PANEL - Abnormal; Notable for the following components:      Result Value   CO2 18 (*)    BUN 21 (*)    Anion gap 19 (*)    All other components within normal limits  CBC - Abnormal; Notable for the following components:   RBC 5.35 (*)    Hemoglobin 16.4 (*)    HCT 51.1 (*)    Platelets 419 (*)    All other components within normal limits  URINALYSIS, ROUTINE W REFLEX MICROSCOPIC - Abnormal; Notable for the following components:   APPearance HAZY (*)    Hgb urine dipstick SMALL (*)    Ketones, ur 80 (*)    Protein, ur 30 (*)    Bacteria, UA RARE (*)    All other components within normal limits  BLOOD GAS, VENOUS - Abnormal; Notable for the following components:   pO2, Ven <31 (*)    All other components within normal limits  BETA-HYDROXYBUTYRIC ACID - Abnormal; Notable for the following components:   Beta-Hydroxybutyric Acid 4.96 (*)    All other components within normal limits  COMPREHENSIVE METABOLIC PANEL - Abnormal; Notable for the following components:   Chloride 97 (*)    CO2 19 (*)    Glucose, Bld 67 (*)    BUN 21 (*)    Total Protein 8.5 (*)    Total Bilirubin 1.7 (*)    Anion gap 20 (*)    All other components within normal limits  LIPASE, BLOOD   LACTIC ACID, PLASMA  MAGNESIUM  PHOSPHORUS  CK    EKG None  Radiology CT ABDOMEN PELVIS W CONTRAST  Result Date: 04/18/2021 CLINICAL DATA:  Concern for bowel obstruction. EXAM: CT ABDOMEN AND PELVIS WITH CONTRAST TECHNIQUE: Multidetector CT imaging of the abdomen and pelvis was performed using the standard protocol following bolus administration of intravenous contrast. RADIATION DOSE REDUCTION: This exam was performed according to the departmental dose-optimization program which includes automated exposure control, adjustment of the mA and/or kV according to patient size and/or  use of iterative reconstruction technique. CONTRAST:  100mL OMNIPAQUE IOHEXOL 300 MG/ML  SOLN COMPARISON:  CT abdomen pelvis dated 03/25/2012. FINDINGS: Lower chest: The visualized lung bases are clear. No intra-abdominal free air or free fluid. Hepatobiliary: No focal liver abnormality is seen. No gallstones, gallbladder wall thickening, or biliary dilatation. Pancreas: Unremarkable. No pancreatic ductal dilatation or surrounding inflammatory changes. Spleen: Normal in size without focal abnormality. Adrenals/Urinary Tract: Several nonobstructing bilateral renal calculi measure up to 5 mm in the inferior pole of the left kidney. No hydronephrosis. There is symmetric enhancement and excretion of contrast by both kidneys. The visualized ureters and urinary bladder appear unremarkable. Stomach/Bowel: Several small scattered colonic diverticula without active inflammatory changes. Mild diffuse thickened appearance of the colon, likely related to underdistention. Mild colitis is less likely. Clinical correlation is recommended. There is no bowel obstruction. No evidence of acute appendicitis. Vascular/Lymphatic: The abdominal aorta and IVC are unremarkable on this noncontrast CT. No portal venous gas. There is no adenopathy. Reproductive: The uterus is anteverted. The endometrium appears somewhat thickened measuring approximately 14  mm. This is however not evaluated by CT. Further evaluation with ultrasound recommended. No adnexal masses. Other: Small fat containing umbilical hernia. Musculoskeletal: Degenerative changes of the spine. Partial L4-L5 fusion. No acute osseous pathology. Scoliosis. IMPRESSION: 1. Underdistention of the colon versus less likely mild colitis. Clinical correlation is recommended no bowel obstruction. 2. Bilateral nonobstructing renal calculi. No hydronephrosis. 3. Thickened endometrium. Further evaluation with ultrasound recommended. Electronically Signed   By: Elgie CollardArash  Radparvar M.D.   On: 04/18/2021 20:37    Procedures Procedures    Medications Ordered in ED Medications  prochlorperazine (COMPAZINE) injection 10 mg (10 mg Intravenous Given 04/18/21 1928)  sodium chloride 0.9 % bolus 1,000 mL (0 mLs Intravenous Stopped 04/18/21 2310)  iohexol (OMNIPAQUE) 300 MG/ML solution 100 mL (100 mLs Intravenous Contrast Given 04/18/21 2025)  promethazine (PHENERGAN) 25 mg in sodium chloride 0.9 % 50 mL IVPB (0 mg Intravenous Stopped 04/18/21 2326)  dextrose 50 % solution 25 mL (25 mLs Intravenous Given 04/18/21 2341)    ED Course/ Medical Decision Making/ A&P                           Medical Decision Making Amount and/or Complexity of Data Reviewed Labs: ordered. Radiology: ordered.  Risk Prescription drug management. Decision regarding hospitalization.    Lewanda Rifeeresa L Loyal is a 61 y.o. female with a past medical history significant for kidney stones who presents with nausea, vomiting, initially diarrhea now constipation and abdominal pain.  According to patient, patient ate at dinner on Saturday night and is unsure if there was any suspicious food.  She reports since Sunday, for last 5 days, she has had nearly constant vomiting.  She initially had some diarrhea but that has resolved and now she is not passing any gas and having continued abdominal pain.  She reports no blood in her emesis or stools but has no  more fluids and her to vomit.  She has dry heaving.  She denies any fevers, chills, chest pain, shortness of breath or urinary changes.  Denies any trauma.  Never had this before.  She is concerned about getting dehydrated as she may provoke more kidney stones.  She tried Zofran with her PCP but has not been working.  On exam, lungs clear and chest nontender.  Abdomen is tender primarily upper abdomen and epigastric area.  Bowel sounds were appreciated.  Flanks and back  nontender.  No focal neurologic deficits.  Mucous membranes are dry.  Clinically concerned that patient has GI bug as she reports that several coworkers have had rotavirus reportedly.  However, with her minimal p.o. intake for the last 5 days, this pain, and the constant nausea, vomiting, and now with no flatus or bowel movements, we will give her some fluids, get labs, some nausea medicine, and get a CT scan.  Patient had some screening labs in triage which did show a metabolic panel with decreased CO2 and elevated anion gap.  Suspect this is all from the vomiting and dehydration but we will check a beta duct uric acid to make sure there is no evidence of DKA although her glucose is 85.  CBC shows no leukocytosis and also likely shows some hemoconcentration with elevated hemoglobin and platelets.  Urinalysis shows ketones and protein also suggestive of dehydration but no evidence of UTI with no nitrites or leukocytes.  Lipase not elevated.  Anticipate reassessment after rehydration, nausea medicine, and work-up.  10:18 PM Other labs have returned.  Patient's beta hydroxybutyric acid is surprisingly elevated at 4.96.  Blood gas does not show significant acidosis.  CT scan shows some thickened endometrium which I discussed with patient and she will follow-up as an outpatient for.  There is also some lack of distention of her colon versus mild colitis.  Clinically I do not suspect she has colitis at this time and patient agrees she denies  any lower abdominal pain at this time.  Lactic acid nonelevated.  Now she has had fluids, we will get a repeat metabolic panel to make sure she is improving.  We will also get mag, Phos, and CK.  If she is tolerating p.o. and metabolic panel has improved, I anticipate she will be stable for discharge home for using nausea medicine and rehydration for what I suspect is a viral gastritis.  If her labs are more concerning, patient may end up needing admission for rehydration.  Repeat metabolic panel shows the gap is worsening.  She also was now hypoglycemic.  We will give half an amp of D50.  Her CK, mag, and Phos were normal.  Clinically she is still feeling bad and still having nausea.  Do not think she is going to be able to maintain hydration at home.  Medicine called and they request a salicylate level and they will come see for admission.  Patient was given Phenergan which started to help after the Zofran and Compazine respectively did not seem to help with her nausea.  Patient will be admitted for hydration and further management.        Final Clinical Impression(s) / ED Diagnoses Final diagnoses:  Nausea and vomiting, unspecified vomiting type  High anion gap metabolic acidosis  Dehydration     Clinical Impression: 1. Nausea and vomiting, unspecified vomiting type   2. High anion gap metabolic acidosis   3. Dehydration     Disposition: Admit  This note was prepared with assistance of Dragon voice recognition software. Occasional wrong-word or sound-a-like substitutions may have occurred due to the inherent limitations of voice recognition software.      Venice Marcucci, Canary Brim, MD 04/18/21 [phone]

## 2021-04-18 NOTE — ED Triage Notes (Signed)
Pt from home for eval of emesis since Sunday. Woke up with abdominal pain, n/v/d that morning, and while diarrhea and abdominal pain have subsided, n/v continue. Emesis x 3 in last 24 hours. Dr. Brigitte Pulse gave rx for Zofran, which she states is not working.  ?

## 2021-04-19 ENCOUNTER — Encounter (HOSPITAL_COMMUNITY): Payer: Self-pay | Admitting: Internal Medicine

## 2021-04-19 DIAGNOSIS — R112 Nausea with vomiting, unspecified: Secondary | ICD-10-CM | POA: Diagnosis not present

## 2021-04-19 DIAGNOSIS — E8729 Other acidosis: Secondary | ICD-10-CM

## 2021-04-19 DIAGNOSIS — R197 Diarrhea, unspecified: Secondary | ICD-10-CM

## 2021-04-19 LAB — CBC
HCT: 43.1 % (ref 36.0–46.0)
Hemoglobin: 13.6 g/dL (ref 12.0–15.0)
MCH: 30.4 pg (ref 26.0–34.0)
MCHC: 31.6 g/dL (ref 30.0–36.0)
MCV: 96.2 fL (ref 80.0–100.0)
Platelets: 332 10*3/uL (ref 150–400)
RBC: 4.48 MIL/uL (ref 3.87–5.11)
RDW: 12.6 % (ref 11.5–15.5)
WBC: 5.3 10*3/uL (ref 4.0–10.5)
nRBC: 0 % (ref 0.0–0.2)

## 2021-04-19 LAB — COMPREHENSIVE METABOLIC PANEL
ALT: 16 U/L (ref 0–44)
AST: 17 U/L (ref 15–41)
Albumin: 3.5 g/dL (ref 3.5–5.0)
Alkaline Phosphatase: 71 U/L (ref 38–126)
Anion gap: 11 (ref 5–15)
BUN: 14 mg/dL (ref 6–20)
CO2: 20 mmol/L — ABNORMAL LOW (ref 22–32)
Calcium: 8.8 mg/dL — ABNORMAL LOW (ref 8.9–10.3)
Chloride: 105 mmol/L (ref 98–111)
Creatinine, Ser: 0.79 mg/dL (ref 0.44–1.00)
GFR, Estimated: >60 mL/min (ref >60)
Glucose, Bld: 88 mg/dL (ref 70–99)
Potassium: 3.3 mmol/L — ABNORMAL LOW (ref 3.5–5.1)
Sodium: 136 mmol/L (ref 135–145)
Total Bilirubin: 1.3 mg/dL — ABNORMAL HIGH (ref 0.3–1.2)
Total Protein: 6.5 g/dL (ref 6.5–8.1)

## 2021-04-19 LAB — SALICYLATE LEVEL: Salicylate Lvl: <7 mg/dL — ABNORMAL LOW (ref 7.0–30.0)

## 2021-04-19 LAB — RESP PANEL BY RT-PCR (FLU A&B, COVID) ARPGX2
Influenza A by PCR: NEGATIVE
Influenza B by PCR: NEGATIVE
SARS Coronavirus 2 by RT PCR: NEGATIVE

## 2021-04-19 LAB — CBG MONITORING, ED: Glucose-Capillary: 113 mg/dL — ABNORMAL HIGH (ref 70–99)

## 2021-04-19 LAB — HIV ANTIBODY (ROUTINE TESTING W REFLEX): HIV Screen 4th Generation wRfx: NONREACTIVE

## 2021-04-19 LAB — CREATININE, SERUM
Creatinine, Ser: 0.71 mg/dL (ref 0.44–1.00)
GFR, Estimated: >60 mL/min (ref >60)

## 2021-04-19 MED ORDER — ENOXAPARIN SODIUM 40 MG/0.4ML IJ SOSY: 40.0000 mg | PREFILLED_SYRINGE | INTRAMUSCULAR | Status: DC

## 2021-04-19 MED ORDER — LACTATED RINGERS IV SOLN: INTRAVENOUS | Status: DC

## 2021-04-19 MED ORDER — POTASSIUM CHLORIDE CRYS ER 20 MEQ PO TBCR
40.0000 meq | EXTENDED_RELEASE_TABLET | Freq: Once | ORAL | Status: AC
  Administered 2021-04-19: 40 meq via ORAL
  Filled 2021-04-19: qty 2

## 2021-04-19 MED ORDER — SERTRALINE HCL 100 MG PO TABS
100.0000 mg | ORAL_TABLET | Freq: Every day | ORAL | Status: DC
  Administered 2021-04-19: 100 mg via ORAL
  Filled 2021-04-19: qty 1

## 2021-04-19 MED ORDER — PROMETHAZINE HCL 25 MG PO TABS: 25.0000 mg | ORAL_TABLET | Freq: Four times a day (QID) | ORAL | 0 refills | Status: AC | PRN

## 2021-04-19 MED ORDER — ONDANSETRON HCL 4 MG PO TABS: 4.0000 mg | ORAL_TABLET | Freq: Four times a day (QID) | ORAL | 0 refills | Status: DC | PRN

## 2021-04-19 NOTE — H&P (Signed)
History and Physical    Judy Jimenez:[id number] DOB: 06-Jan-1961 DOA: 04/18/2021  PCP: Cleatis Polka., MD  Patient coming from: Home.  Chief Complaint: Nausea vomiting and diarrhea.  HPI: Judy Jimenez is a 61 y.o. female with no significant past medical history has been experiencing nausea vomiting and diarrhea for the past 4 days.  Denies any fever chills or blood in the vomitus or diarrhea.  Initially had some abdominal discomfort.  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  Denies any recent travel or taking antibiotics.  ED Course: In the ER labs show high anion gap metabolic acidosis.  CT scan does not show anything acute except for endometrial thickening.  Lactic acid is normal.  Patient was started on fluid bolus admitted for further work-up.  Review of Systems: As per HPI, rest all negative.   Past Medical History:  Diagnosis Date   Renal disorder    kidney stone    Past Surgical History:  Procedure Laterality Date   APPENDECTOMY       reports that she has never smoked. She has never used smokeless tobacco. She reports that she does not drink alcohol. No history on file for drug use.  Allergies  Allergen Reactions   Elemental Sulfur Hives    Family History  Problem Relation Age of Onset   Diabetes Mellitus II Father    CAD Father     Prior to Admission medications   Medication Sig Start Date End Date Taking? Authorizing Provider  estradiol (ESTRACE) 2 MG tablet Take 2 mg by mouth daily. 02/27/21  Yes [provider]  Multiple Vitamin (MULTIVITAMIN WITH MINERALS) TABS Take 1 tablet by mouth daily.   Yes [provider]  Omega-3 Fatty Acids (FISH OIL) 1000 MG CAPS Take 1 capsule by mouth daily.   Yes [provider]  ondansetron (ZOFRAN) 4 MG tablet Take 4 mg by mouth every 6 (six) hours as needed for nausea or vomiting. 04/16/21  Yes [provider]  progesterone (PROMETRIUM) 200 MG capsule Take 200 mg by mouth at bedtime. 02/27/21  Yes [provider]  sertraline (ZOLOFT) 100 MG tablet Take 100 mg by mouth daily. 04/12/21  Yes [provider]  aspirin 81 MG tablet Take 81 mg by mouth daily.    [provider]  HYDROcodone-acetaminophen (NORCO/VICODIN) 5-325 MG per tablet Take 1 or 2 po Q 6hrs for pain 03/25/12   Devoria Albe, MD  norgestrel-ethinyl estradiol (LO/OVRAL,CRYSELLE) 0.3-30 MG-MCG tablet Take 1 tablet by mouth daily.    [provider]    Physical Exam: Constitutional: Moderately built and nourished. Vitals:   04/18/21 2315 04/18/21 2330 04/18/21 2345 04/19/21 0000  BP: 123/71 111/74 113/79 109/78  Pulse: 74 66 90 77  Resp:      Temp:      TempSrc:      SpO2: 98% 98% 98% 95%  Weight:      Height:       Eyes: Anicteric no pallor. ENMT: No discharge from the ears eyes nose or mouth. Neck: No mass felt.  No neck rigidity. Respiratory: No rhonchi or crepitations. Cardiovascular: S1-S2 heard. Abdomen: Soft nontender bowel sound present. Musculoskeletal: No edema. Skin: No rash. Neurologic: Alert awake oriented time place and person.  Moves all extremities. Psychiatric: Appears normal.  Normal affect.   Labs on Admission: I have personally reviewed following labs and imaging studies  CBC:  Recent Labs  Lab 04/18/21 1206  WBC 7.4  HGB 16.4*  HCT 51.1*  MCV 95.5  PLT 419*   Basic Metabolic Panel: Recent Labs  Lab 04/18/21 1206 04/18/21 1930  NA 138 136  K 3.9 4.1  CL 101 97*  CO2 18* 19*  GLUCOSE 85 67*  BUN 21* 21*  CREATININE 0.97 0.95  CALCIUM 9.8 10.2  MG  --  2.4  PHOS  --  4.2   GFR: Estimated Creatinine Clearance: 45.8 mL/min (by C-G formula based on SCr of 0.95 mg/dL). Liver Function Tests: Recent Labs  Lab 04/18/21 1206 04/18/21 1930  AST 23 28  ALT 19 23  ALKPHOS 83 95  BILITOT 1.2 1.7*  PROT 8.0 8.5*  ALBUMIN 4.3 4.8   Recent Labs  Lab  04/18/21 1206  LIPASE 37   No results for input(s): AMMONIA in the last 168 hours. Coagulation Profile: No results for input(s): INR, PROTIME in the last 168 hours. Cardiac Enzymes: Recent Labs  Lab 04/18/21 1930  CKTOTAL 49   BNP (last 3 results) No results for input(s): PROBNP in the last 8760 hours. HbA1C: No results for input(s): HGBA1C in the last 72 hours. CBG: Recent Labs  Lab 04/19/21 0125  GLUCAP 113*   Lipid Profile: No results for input(s): CHOL, HDL, LDLCALC, TRIG, CHOLHDL, LDLDIRECT in the last 72 hours. Thyroid Function Tests: No results for input(s): TSH, T4TOTAL, FREET4, T3FREE, THYROIDAB in the last 72 hours. Anemia Panel: No results for input(s): VITAMINB12, FOLATE, FERRITIN, TIBC, IRON, RETICCTPCT in the last 72 hours. Urine analysis:    Component Value Date/Time   COLORURINE YELLOW 04/18/2021 1158   APPEARANCEUR HAZY (A) 04/18/2021 1158   LABSPEC 1.023 04/18/2021 1158   PHURINE 5.0 04/18/2021 1158   GLUCOSEU NEGATIVE 04/18/2021 1158   HGBUR SMALL (A) 04/18/2021 1158   BILIRUBINUR NEGATIVE 04/18/2021 1158   KETONESUR 80 (A) 04/18/2021 1158   PROTEINUR 30 (A) 04/18/2021 1158   UROBILINOGEN 1.0 03/25/2012 1836   NITRITE NEGATIVE 04/18/2021 1158   LEUKOCYTESUR NEGATIVE 04/18/2021 1158   Sepsis Labs: @LABRCNTIP (procalcitonin:4,lacticidven:4) )No results found for this or any previous visit (from the past 240 hour(s)).   Radiological Exams on Admission: CT ABDOMEN PELVIS W CONTRAST  Result Date: 04/18/2021 CLINICAL DATA:  Concern for bowel obstruction. EXAM: CT ABDOMEN AND PELVIS WITH CONTRAST TECHNIQUE: Multidetector CT imaging of the abdomen and pelvis was performed using the standard protocol following bolus administration of intravenous contrast. RADIATION DOSE REDUCTION: This exam was performed according to the departmental dose-optimization program which includes automated exposure control, adjustment of the mA and/or kV according to patient size  and/or use of iterative reconstruction technique. CONTRAST:  OMNIPAQUE IOHEXOL 300 MG/ML  SOLN COMPARISON:  CT abdomen pelvis dated 03/25/2012. FINDINGS: Lower chest: The visualized lung bases are clear. No intra-abdominal free air or free fluid. Hepatobiliary: No focal liver abnormality is seen. No gallstones, gallbladder wall thickening, or biliary dilatation. Pancreas: Unremarkable. No pancreatic ductal dilatation or surrounding inflammatory changes. Spleen: Normal in size without focal abnormality. Adrenals/Urinary Tract: Several nonobstructing bilateral renal calculi measure up to 5 mm in the inferior pole of the left kidney. No hydronephrosis. There is symmetric enhancement and excretion of contrast by both kidneys. The visualized ureters and urinary bladder appear unremarkable. Stomach/Bowel: Several small scattered colonic diverticula without active inflammatory changes. Mild diffuse thickened appearance of the colon, likely related to underdistention. Mild colitis is less likely. Clinical correlation is recommended. There is no bowel obstruction. No evidence of acute  appendicitis. Vascular/Lymphatic: The abdominal aorta and IVC are unremarkable on this noncontrast CT. No portal venous gas. There is no adenopathy. Reproductive: The uterus is anteverted. The endometrium appears somewhat thickened measuring approximately 14 mm. This is however not evaluated by CT. Further evaluation with ultrasound recommended. No adnexal masses. Other: Small fat containing umbilical hernia. Musculoskeletal: Degenerative changes of the spine. Partial L4-L5 fusion. No acute osseous pathology. Scoliosis. IMPRESSION: 1. Underdistention of the colon versus less likely mild colitis. Clinical correlation is recommended no bowel obstruction. 2. Bilateral nonobstructing renal calculi. No hydronephrosis. 3. Thickened endometrium. Further evaluation with ultrasound recommended. Electronically Signed   By: Elgie Collard M.D.    On: 04/18/2021 20:37      Assessment/Plan Principal Problem:   Nausea vomiting and diarrhea Active Problems:   High anion gap metabolic acidosis   Nausea & vomiting    Intractable nausea vomiting and diarrhea likely from gastroenteritis.  GI pathogen panel is pending.  Continue with hydration.  Patient was found to be mildly hypoglycemic.  We will closely follow CBGs.  GI pathogen panel is pending. High anion gap metabolic acidosis likely from vomiting and diarrhea.  Hydrate and follow metabolic panel.  Salicylate levels are pending. Endometrial thickening seen in the CAT scan will need further work-up as outpatient.  COVID test is pending.  DVT prophylaxis: Lovenox. Code Status: Full code. Family Communication: Family at the bedside. Disposition Plan: Home. Consults called: None. Admission status: Observation.   Eduard Clos MD [facility] Pager [phone].  If 7PM-7AM, please contact night-coverage [email or web address] Password [facility]  04/19/2021, 2:44 AM

## 2021-04-19 NOTE — ED Notes (Signed)
Ghimire MD at beside. ?

## 2021-04-19 NOTE — ED Notes (Signed)
Pt ambulated tot he bathroom w/o experiencing dizziness. Was also able to hold down the food w/o n/v. MD made aware. DC order in.Marland Kitchen  ?

## 2021-04-19 NOTE — Discharge Summary (Addendum)
PATIENT DETAILS Name: Judy Jimenez Age: 61 y.o. Sex: female Date of Birth: 1960-11-01 MRN: [id number]. Admitting Physician: Rise Patience, MD [id number], Emily Filbert., MD  Admit Date: 04/18/2021 Discharge date: 04/19/2021  Recommendations for Outpatient Follow-up:  Follow up with PCP in 1-2 weeks Please obtain CMP/CBC in one week  Admitted From:  Home  Disposition: Home   Discharge Condition: good  CODE STATUS:   Code Status: Full Code   Diet recommendation:  Diet Order             Diet regular Room service appropriate? Yes; Fluid consistency: Thin  Diet effective now           Diet - low sodium heart healthy                    Brief Summary: 61 year old female with no significant past medical history-presented with vomiting and diarrhea x4 days.  She was subsequently admitted to the hospitalist service.  See below for further details.  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  She was managed with supportive care-her diarrhea/vomiting has completely resolved.  She was given IV fluids for dehydration.  She has tolerated regular diet this morning without any nausea or vomiting.  She feels much better-and is stable for discharge.  Since diarrhea resolved-stool studies were never sent.  Hypokalemia: Due to GI loss-repleted.  Endometrial thickening: Seen incidentally on CT abdomen-I have asked the patient to follow-up with her primary GYN.  Patient is aware in rare cases-that they could be malignancy involved.  BMI: Estimated body mass index is 23.44 kg/m as calculated from the following:   Height as of this encounter: 5' (1.524 m).   Weight as of this encounter: 54.4 kg.    Discharge Diagnoses:  Principal Problem:   Nausea vomiting and diarrhea Active Problems:   High anion gap metabolic acidosis   Nausea & vomiting   Discharge  Instructions:  Activity:  As tolerated  Discharge Instructions     Call MD for:  persistant nausea and vomiting   Complete by: As directed    Diet - low sodium heart healthy   Complete by: As directed    Discharge instructions   Complete by: As directed    Follow with Primary MD  Ginger Organ., MD in 1-2 weeks  Please get a complete blood count and chemistry panel checked by your Primary MD at your next visit, and again as instructed by your Primary MD.  Get Medicines reviewed and adjusted: Please take all your medications with you for your next visit with your Primary MD  Laboratory/radiological data: Please request your Primary MD to go over all hospital tests and procedure/radiological results at the follow up, please ask your Primary MD to get all Hospital records sent to his/her office.  In some cases, they will be blood work, cultures and biopsy results pending at the time of your discharge. Please request that your primary care M.D. follows up on these results.  Also Note the following: If you experience worsening of your admission symptoms, develop shortness of breath, life threatening emergency, suicidal or homicidal thoughts you must seek medical attention immediately by calling 911 or calling your MD immediately  if symptoms less severe.  You must read complete instructions/literature along with all the possible adverse reactions/side effects for all the Medicines you take and that have been prescribed to you. Take any new Medicines  after you have completely understood and accpet all the possible adverse reactions/side effects.   Do not drive when taking Pain medications or sleeping medications (Benzodaizepines)  Do not take more than prescribed Pain, Sleep and Anxiety Medications. It is not advisable to combine anxiety,sleep and pain medications without talking with your primary care practitioner  Special Instructions: If you have smoked or chewed Tobacco  in the  last 2 yrs please stop smoking, stop any regular Alcohol  and or any Recreational drug use.  Wear Seat belts while driving.  Please note: You were cared for by a hospitalist during your hospital stay. Once you are discharged, your primary care physician will handle any further medical issues. Please note that NO REFILLS for any discharge medications will be authorized once you are discharged, as it is imperative that you return to your primary care physician (or establish a relationship with a primary care physician if you do not have one) for your post hospital discharge needs so that they can reassess your need for medications and monitor your lab values.   Increase activity slowly   Complete by: As directed       Allergies as of 04/19/2021       Reactions   Elemental Sulfur Hives        Medication List     STOP taking these medications    aspirin 81 MG tablet   HYDROcodone-acetaminophen 5-325 MG tablet Commonly known as: NORCO/VICODIN   norgestrel-ethinyl estradiol 0.3-30 MG-MCG tablet Commonly known as: LO/OVRAL   ondansetron 4 MG tablet Commonly known as: ZOFRAN       TAKE these medications    estradiol 2 MG tablet Commonly known as: ESTRACE Take 2 mg by mouth daily.   Fish Oil 1000 MG Caps Take 1 capsule by mouth daily.   multivitamin with minerals Tabs tablet Take 1 tablet by mouth daily.   progesterone 200 MG capsule Commonly known as: PROMETRIUM Take 200 mg by mouth at bedtime.   promethazine 25 MG tablet Commonly known as: PHENERGAN Take 1 tablet (25 mg total) by mouth every 6 (six) hours as needed for nausea or vomiting.   sertraline 100 MG tablet Commonly known as: ZOLOFT Take 100 mg by mouth daily.        Follow-up Information     Ginger Organ., MD. Schedule an appointment as soon as possible for a visit in 1 week(s).   Specialty: Internal Medicine Contact information: [place] [address] [phone]                 Allergies  Allergen Reactions   Elemental Sulfur Hives     Other Procedures/Studies: CT ABDOMEN PELVIS W CONTRAST  Result Date: 04/18/2021 CLINICAL DATA:  Concern for bowel obstruction. EXAM: CT ABDOMEN AND PELVIS WITH CONTRAST TECHNIQUE: Multidetector CT imaging of the abdomen and pelvis was performed using the standard protocol following bolus administration of intravenous contrast. RADIATION DOSE REDUCTION: This exam was performed according to the departmental dose-optimization program which includes automated exposure control, adjustment of the mA and/or kV according to patient size and/or use of iterative reconstruction technique. CONTRAST:  181mL OMNIPAQUE IOHEXOL 300 MG/ML  SOLN COMPARISON:  CT abdomen pelvis dated 03/25/2012. FINDINGS: Lower chest: The visualized lung bases are clear. No intra-abdominal free air or free fluid. Hepatobiliary: No focal liver abnormality is seen. No gallstones, gallbladder wall thickening, or biliary dilatation. Pancreas: Unremarkable. No pancreatic ductal dilatation or surrounding inflammatory changes. Spleen: Normal in size without  focal abnormality. Adrenals/Urinary Tract: Several nonobstructing bilateral renal calculi measure up to 5 mm in the inferior pole of the left kidney. No hydronephrosis. There is symmetric enhancement and excretion of contrast by both kidneys. The visualized ureters and urinary bladder appear unremarkable. Stomach/Bowel: Several small scattered colonic diverticula without active inflammatory changes. Mild diffuse thickened appearance of the colon, likely related to underdistention. Mild colitis is less likely. Clinical correlation is recommended. There is no bowel obstruction. No evidence of acute appendicitis. Vascular/Lymphatic: The abdominal aorta and IVC are unremarkable on this noncontrast CT. No portal venous gas. There is no adenopathy. Reproductive: The uterus is anteverted. The endometrium appears somewhat thickened  measuring approximately 14 mm. This is however not evaluated by CT. Further evaluation with ultrasound recommended. No adnexal masses. Other: Small fat containing umbilical hernia. Musculoskeletal: Degenerative changes of the spine. Partial L4-L5 fusion. No acute osseous pathology. Scoliosis. IMPRESSION: 1. Underdistention of the colon versus less likely mild colitis. Clinical correlation is recommended no bowel obstruction. 2. Bilateral nonobstructing renal calculi. No hydronephrosis. 3. Thickened endometrium. Further evaluation with ultrasound recommended. Electronically Signed   By: Anner Crete M.D.   On: 04/18/2021 20:37     TODAY-DAY OF DISCHARGE:  Subjective:   Peg Salminen today has no headache,no chest abdominal pain,no new weakness tingling or numbness, feels much better wants to go home today.  Objective:   Blood pressure 103/70, pulse 68, temperature 98.1 F (36.7 C), temperature source Oral, resp. rate 15, height 5' (1.524 m), weight 54.4 kg, SpO2 97 %.  Intake/Output Summary (Last 24 hours) at 04/19/2021 1220 Last data filed at 04/18/2021 2326 Gross per 24 hour  Intake 1050 ml  Output --  Net 1050 ml   Filed Weights   04/18/21 1157  Weight: 54.4 kg    Exam: Awake Alert, Oriented *3, No new F.N deficits, Normal affect [place].AT,PERRAL Supple Neck,No JVD, No cervical lymphadenopathy appriciated.  Symmetrical Chest wall movement, Good air movement bilaterally, CTAB RRR,No Gallops,Rubs or new Murmurs, No Parasternal Heave +ve B.Sounds, Abd Soft, Non tender, No organomegaly appriciated, No rebound -guarding or rigidity. No Cyanosis, Clubbing or edema, No new Rash or bruise   PERTINENT RADIOLOGIC STUDIES: CT ABDOMEN PELVIS W CONTRAST  Result Date: 04/18/2021 CLINICAL DATA:  Concern for bowel obstruction. EXAM: CT ABDOMEN AND PELVIS WITH CONTRAST TECHNIQUE: Multidetector CT imaging of the abdomen and pelvis was performed using the standard protocol following bolus  administration of intravenous contrast. RADIATION DOSE REDUCTION: This exam was performed according to the departmental dose-optimization program which includes automated exposure control, adjustment of the mA and/or kV according to patient size and/or use of iterative reconstruction technique. CONTRAST:  132mL OMNIPAQUE IOHEXOL 300 MG/ML  SOLN COMPARISON:  CT abdomen pelvis dated 03/25/2012. FINDINGS: Lower chest: The visualized lung bases are clear. No intra-abdominal free air or free fluid. Hepatobiliary: No focal liver abnormality is seen. No gallstones, gallbladder wall thickening, or biliary dilatation. Pancreas: Unremarkable. No pancreatic ductal dilatation or surrounding inflammatory changes. Spleen: Normal in size without focal abnormality. Adrenals/Urinary Tract: Several nonobstructing bilateral renal calculi measure up to 5 mm in the inferior pole of the left kidney. No hydronephrosis. There is symmetric enhancement and excretion of contrast by both kidneys. The visualized ureters and urinary bladder appear unremarkable. Stomach/Bowel: Several small scattered colonic diverticula without active inflammatory changes. Mild diffuse thickened appearance of the colon, likely related to underdistention. Mild colitis is less likely. Clinical correlation is recommended. There is no bowel obstruction. No evidence of acute appendicitis. Vascular/Lymphatic: The  abdominal aorta and IVC are unremarkable on this noncontrast CT. No portal venous gas. There is no adenopathy. Reproductive: The uterus is anteverted. The endometrium appears somewhat thickened measuring approximately 14 mm. This is however not evaluated by CT. Further evaluation with ultrasound recommended. No adnexal masses. Other: Small fat containing umbilical hernia. Musculoskeletal: Degenerative changes of the spine. Partial L4-L5 fusion. No acute osseous pathology. Scoliosis. IMPRESSION: 1. Underdistention of the colon versus less likely mild colitis.  Clinical correlation is recommended no bowel obstruction. 2. Bilateral nonobstructing renal calculi. No hydronephrosis. 3. Thickened endometrium. Further evaluation with ultrasound recommended. Electronically Signed   By: Anner Crete M.D.   On: 04/18/2021 20:37     PERTINENT LAB RESULTS: CBC: Recent Labs    04/18/21 1206 04/19/21 0256  WBC 7.4 5.3  HGB 16.4* 13.6  HCT 51.1* 43.1  PLT 419* 332   CMET CMP     Component Value Date/Time   NA 136 04/19/2021 0256   K 3.3 (L) 04/19/2021 0256   CL 105 04/19/2021 0256   CO2 20 (L) 04/19/2021 0256   GLUCOSE 88 04/19/2021 0256   BUN 14 04/19/2021 0256   CREATININE 0.71 04/19/2021 0256   CREATININE 0.79 04/19/2021 0256   CALCIUM 8.8 (L) 04/19/2021 0256   PROT 6.5 04/19/2021 0256   ALBUMIN 3.5 04/19/2021 0256   AST 17 04/19/2021 0256   ALT 16 04/19/2021 0256   ALKPHOS 71 04/19/2021 0256   BILITOT 1.3 (H) 04/19/2021 0256   GFRNONAA >60 04/19/2021 0256   GFRNONAA >60 04/19/2021 0256    GFR Estimated Creatinine Clearance: 54.4 mL/min (by C-G formula based on SCr of 0.71 mg/dL). Recent Labs    04/18/21 1206  LIPASE 37   Recent Labs    04/18/21 1930  CKTOTAL 36   Invalid input(s): POCBNP No results for input(s): DDIMER in the last 72 hours. No results for input(s): HGBA1C in the last 72 hours. No results for input(s): CHOL, HDL, LDLCALC, TRIG, CHOLHDL, LDLDIRECT in the last 72 hours. No results for input(s): TSH, T4TOTAL, T3FREE, THYROIDAB in the last 72 hours.  Invalid input(s): FREET3 No results for input(s): VITAMINB12, FOLATE, FERRITIN, TIBC, IRON, RETICCTPCT in the last 72 hours. Coags: No results for input(s): INR in the last 72 hours.  Invalid input(s): PT Microbiology: Recent Results (from the past 240 hour(s))  Resp Panel by RT-PCR (Flu A&B, Covid) Nasopharyngeal Swab     Status: None   Collection Time: 04/19/21  3:57 AM   Specimen: Nasopharyngeal Swab; Nasopharyngeal(NP) swabs in vial transport medium   Result Value Ref Range Status   SARS Coronavirus 2 by RT PCR NEGATIVE NEGATIVE Final    Comment: (NOTE) SARS-CoV-2 target nucleic acids are NOT DETECTED.  The SARS-CoV-2 RNA is generally detectable in upper respiratory specimens during the acute phase of infection. The lowest concentration of SARS-CoV-2 viral copies this assay can detect is 138 copies/mL. A negative result does not preclude SARS-Cov-2 infection and should not be used as the sole basis for treatment or other patient management decisions. A negative result may occur with  improper specimen collection/handling, submission of specimen other than nasopharyngeal swab, presence of viral mutation(s) within the areas targeted by this assay, and inadequate number of viral copies(<138 copies/mL). A negative result must be combined with clinical observations, patient history, and epidemiological information. The expected result is Negative.  Fact Sheet for Patients:  EntrepreneurPulse.com.au  Fact Sheet for Healthcare Providers:  IncredibleEmployment.be  This test is no t yet approved or cleared by  the [employer] and  has been authorized for detection and/or diagnosis of SARS-CoV-2 by FDA under an Emergency Use Authorization (EUA). This EUA will remain  in effect (meaning this test can be used) for the duration of the COVID-19 declaration under Section 564(b)(1) of the Act, 21 U.S.C.section 360bbb-3(b)(1), unless the authorization is terminated  or revoked sooner.       Influenza A by PCR NEGATIVE NEGATIVE Final   Influenza B by PCR NEGATIVE NEGATIVE Final    Comment: (NOTE) The Xpert Xpress SARS-CoV-2/FLU/RSV plus assay is intended as an aid in the diagnosis of influenza from Nasopharyngeal swab specimens and should not be used as a sole basis for treatment. Nasal washings and aspirates are unacceptable for Xpert Xpress SARS-CoV-2/FLU/RSV testing.  Fact Sheet for  Patients: EntrepreneurPulse.com.au  Fact Sheet for Healthcare Providers: IncredibleEmployment.be  This test is not yet approved or cleared by the [place] FDA and has been authorized for detection and/or diagnosis of SARS-CoV-2 by FDA under an Emergency Use Authorization (EUA). This EUA will remain in effect (meaning this test can be used) for the duration of the COVID-19 declaration under Section 564(b)(1) of the Act, 21 U.S.C. section 360bbb-3(b)(1), unless the authorization is terminated or revoked.  Performed at [facility], [place] [address] [place], [place] [address]     FURTHER DISCHARGE INSTRUCTIONS:  Get Medicines reviewed and adjusted: Please take all your medications with you for your next visit with your Primary MD  Laboratory/radiological data: Please request your Primary MD to go over all hospital tests and procedure/radiological results at the follow up, please ask your Primary MD to get all Hospital records sent to his/her office.  In some cases, they will be blood work, cultures and biopsy results pending at the time of your discharge. Please request that your primary care M.D. goes through all the records of your hospital data and follows up on these results.  Also Note the following: If you experience worsening of your admission symptoms, develop shortness of breath, life threatening emergency, suicidal or homicidal thoughts you must seek medical attention immediately by calling 911 or calling your MD immediately  if symptoms less severe.  You must read complete instructions/literature along with all the possible adverse reactions/side effects for all the Medicines you take and that have been prescribed to you. Take any new Medicines after you have completely understood and accpet all the possible adverse reactions/side effects.   Do not drive when taking Pain medications or sleeping medications (Benzodaizepines)  Do  not take more than prescribed Pain, Sleep and Anxiety Medications. It is not advisable to combine anxiety,sleep and pain medications without talking with your primary care practitioner  Special Instructions: If you have smoked or chewed Tobacco  in the last 2 yrs please stop smoking, stop any regular Alcohol  and or any Recreational drug use.  Wear Seat belts while driving.  Please note: You were cared for by a hospitalist during your hospital stay. Once you are discharged, your primary care physician will handle any further medical issues. Please note that NO REFILLS for any discharge medications will be authorized once you are discharged, as it is imperative that you return to your primary care physician (or establish a relationship with a primary care physician if you do not have one) for your post hospital discharge needs so that they can reassess your need for medications and monitor your lab values.  Total Time spent coordinating discharge including counseling, education and face to  face time equals greater than 30 minutes.  SignedOren Binet 04/19/2021 12:20 PM

## 2021-05-14 ENCOUNTER — Encounter (HOSPITAL_COMMUNITY): Payer: Self-pay | Admitting: Radiology

## 2022-07-09 DIAGNOSIS — R82998 Other abnormal findings in urine: Secondary | ICD-10-CM | POA: Diagnosis not present

## 2022-11-18 DIAGNOSIS — R109 Unspecified abdominal pain: Secondary | ICD-10-CM | POA: Diagnosis not present

## 2022-11-18 DIAGNOSIS — N200 Calculus of kidney: Secondary | ICD-10-CM | POA: Diagnosis not present

## 2022-11-18 DIAGNOSIS — R319 Hematuria, unspecified: Secondary | ICD-10-CM | POA: Diagnosis not present

## 2022-11-21 ENCOUNTER — Encounter (HOSPITAL_COMMUNITY): Payer: Self-pay | Admitting: Urology

## 2022-11-21 ENCOUNTER — Ambulatory Visit (HOSPITAL_COMMUNITY): Payer: BC Managed Care – PPO

## 2022-11-21 ENCOUNTER — Ambulatory Visit (HOSPITAL_COMMUNITY)
Admission: RE | Admit: 2022-11-21 | Discharge: 2022-11-21 | Disposition: A | Discharge status: Home / Self Care | Payer: BC Managed Care – PPO | Attending: Urology | Admitting: Urology

## 2022-11-21 ENCOUNTER — Ambulatory Visit (HOSPITAL_COMMUNITY): Payer: BC Managed Care – PPO | Admitting: Anesthesiology

## 2022-11-21 ENCOUNTER — Other Ambulatory Visit: Payer: Self-pay | Admitting: Urology

## 2022-11-21 ENCOUNTER — Encounter (HOSPITAL_COMMUNITY)
Admission: RE | Disposition: A | Discharge status: Home / Self Care | Payer: Self-pay | Source: Home / Self Care | Attending: Urology

## 2022-11-21 DIAGNOSIS — N202 Calculus of kidney with calculus of ureter: Secondary | ICD-10-CM | POA: Diagnosis present

## 2022-11-21 HISTORY — PX: CYSTOSCOPY WITH RETROGRADE PYELOGRAM, URETEROSCOPY AND STENT PLACEMENT: SHX5789

## 2022-11-21 SURGERY — CYSTOURETEROSCOPY, WITH RETROGRADE PYELOGRAM AND STENT INSERTION
Anesthesia: General | Laterality: Right

## 2022-11-21 MED ORDER — ACETAMINOPHEN 10 MG/ML IV SOLN
1000.0000 mg | Freq: Once | INTRAVENOUS | Status: DC | PRN
  Administered 2022-11-21: 1000 mg via INTRAVENOUS

## 2022-11-21 MED ORDER — FENTANYL CITRATE PF 50 MCG/ML IJ SOSY: 25.0000 ug | PREFILLED_SYRINGE | INTRAMUSCULAR | Status: DC | PRN

## 2022-11-21 MED ORDER — LIDOCAINE HCL (PF) 2 % IJ SOLN
INTRAMUSCULAR | Status: AC
  Filled 2022-11-21: qty 5

## 2022-11-21 MED ORDER — ACETAMINOPHEN 10 MG/ML IV SOLN
INTRAVENOUS | Status: AC
  Filled 2022-11-21: qty 100

## 2022-11-21 MED ORDER — EPHEDRINE SULFATE (PRESSORS) 50 MG/ML IJ SOLN
INTRAMUSCULAR | Status: DC | PRN
Start: 2022-11-21 — End: 2022-11-21
  Administered 2022-11-21: 5 mg via INTRAVENOUS

## 2022-11-21 MED ORDER — LACTATED RINGERS IV SOLN: INTRAVENOUS | Status: DC

## 2022-11-21 MED ORDER — IOHEXOL 300 MG/ML  SOLN
INTRAMUSCULAR | Status: DC | PRN
  Administered 2022-11-21: 6 mL

## 2022-11-21 MED ORDER — LIDOCAINE HCL (CARDIAC) PF 100 MG/5ML IV SOSY
PREFILLED_SYRINGE | INTRAVENOUS | Status: DC | PRN
  Administered 2022-11-21: 100 mg via INTRAVENOUS

## 2022-11-21 MED ORDER — SODIUM CHLORIDE 0.9 % IR SOLN
Status: DC | PRN
  Administered 2022-11-21: 3000 mL via INTRAVESICAL

## 2022-11-21 MED ORDER — FENTANYL CITRATE (PF) 100 MCG/2ML IJ SOLN
INTRAMUSCULAR | Status: AC
  Filled 2022-11-21: qty 2

## 2022-11-21 MED ORDER — PROPOFOL 10 MG/ML IV BOLUS
INTRAVENOUS | Status: AC
  Filled 2022-11-21: qty 20

## 2022-11-21 MED ORDER — CEFAZOLIN SODIUM-DEXTROSE 2-4 GM/100ML-% IV SOLN
2.0000 g | INTRAVENOUS | Status: AC
  Administered 2022-11-21: 2 g via INTRAVENOUS
  Filled 2022-11-21: qty 100

## 2022-11-21 MED ORDER — MIDAZOLAM HCL 5 MG/5ML IJ SOLN
INTRAMUSCULAR | Status: DC | PRN
  Administered 2022-11-21: 2 mg via INTRAVENOUS

## 2022-11-21 MED ORDER — DEXAMETHASONE SODIUM PHOSPHATE 4 MG/ML IJ SOLN
INTRAMUSCULAR | Status: DC | PRN
  Administered 2022-11-21: 4 mg via INTRAVENOUS

## 2022-11-21 MED ORDER — FENTANYL CITRATE (PF) 100 MCG/2ML IJ SOLN
INTRAMUSCULAR | Status: DC | PRN
  Administered 2022-11-21: 50 ug via INTRAVENOUS

## 2022-11-21 MED ORDER — ONDANSETRON HCL 4 MG/2ML IJ SOLN
INTRAMUSCULAR | Status: DC | PRN
  Administered 2022-11-21: 4 mg via INTRAVENOUS

## 2022-11-21 MED ORDER — MIDAZOLAM HCL 2 MG/2ML IJ SOLN
INTRAMUSCULAR | Status: AC
  Filled 2022-11-21: qty 2

## 2022-11-21 MED ORDER — PROPOFOL 10 MG/ML IV BOLUS
INTRAVENOUS | Status: DC | PRN
  Administered 2022-11-21: 120 mg via INTRAVENOUS

## 2022-11-21 SURGICAL SUPPLY — 25 items
BAG COUNTER SPONGE SURGICOUNT (BAG) IMPLANT
BAG SPNG CNTER NS LX DISP (BAG)
BAG URO CATCHER STRL LF (MISCELLANEOUS) ×1 IMPLANT
BASKET ZERO TIP NITINOL 2.4FR (BASKET) IMPLANT
BSKT STON RTRVL ZERO TP 2.4FR (BASKET) ×1
CATH URETL OPEN END 6FR 70 (CATHETERS) IMPLANT
CLOTH BEACON ORANGE TIMEOUT ST (SAFETY) ×1 IMPLANT
GLOVE SURG LX STRL 7.5 STRW (GLOVE) ×1 IMPLANT
GOWN STRL REUS W/ TWL XL LVL3 (GOWN DISPOSABLE) ×1 IMPLANT
GOWN STRL REUS W/TWL XL LVL3 (GOWN DISPOSABLE) ×1
GUIDEWIRE STR DUAL SENSOR (WIRE) ×1 IMPLANT
GUIDEWIRE ZIPWRE .038 STRAIGHT (WIRE) IMPLANT
IV NS 1000ML (IV SOLUTION) ×1
IV NS 1000ML BAXH (IV SOLUTION) ×1 IMPLANT
KIT TURNOVER KIT A (KITS) IMPLANT
LASER FIB FLEXIVA PULSE ID 365 (Laser) IMPLANT
MANIFOLD NEPTUNE II (INSTRUMENTS) ×1 IMPLANT
PACK CYSTO (CUSTOM PROCEDURE TRAY) ×1 IMPLANT
PAD PREP 24X48 CUFFED NSTRL (MISCELLANEOUS) ×1 IMPLANT
SHEATH NAVIGATOR HD 12/14X36 (SHEATH) IMPLANT
STENT URET 6FRX24 CONTOUR (STENTS) IMPLANT
TRACTIP FLEXIVA PULS ID 200XHI (Laser) IMPLANT
TRACTIP FLEXIVA PULSE ID 200 (Laser)
TUBING CONNECTING 10 (TUBING) ×1 IMPLANT
TUBING UROLOGY SET (TUBING) ×1 IMPLANT

## 2022-11-21 NOTE — Transfer of Care (Signed)
Immediate Anesthesia Transfer of Care Note  Patient: Judy Jimenez  Procedure(s) Performed: CYSTOSCOPY WITH RETROGRADE PYELOGRAM, URETEROSCOPY AND STENT PLACEMENT; LASER LITHOTRIPSY (Right)  Patient Location: PACU  Anesthesia Type:General  Level of Consciousness: drowsy  Airway & Oxygen Therapy: Patient Spontanous Breathing and Patient connected to nasal cannula oxygen  Post-op Assessment: Report given to RN and Post -op Vital signs reviewed and stable  Post vital signs: Reviewed and stable  Last Vitals:  Vitals Value Taken Time  BP 145/74 11/21/22 1624  Temp    Pulse 76 11/21/22 1626  Resp 17 11/21/22 1626  SpO2 100 % 11/21/22 1626  Vitals shown include unfiled device data.  Last Pain:  Vitals:   11/21/22 1336  TempSrc:   PainSc: 3       Patients Stated Pain Goal: 3 (11/21/22 1336)  Complications: No notable events documented.

## 2022-11-21 NOTE — Anesthesia Postprocedure Evaluation (Signed)
Anesthesia Post Note  Patient: Judy Jimenez  Procedure(s) Performed: CYSTOSCOPY WITH RETROGRADE PYELOGRAM, URETEROSCOPY AND STENT PLACEMENT; LASER LITHOTRIPSY (Right)     Patient location during evaluation: PACU Anesthesia Type: General Level of consciousness: awake and alert Pain management: pain level controlled Vital Signs Assessment: post-procedure vital signs reviewed and stable Respiratory status: spontaneous breathing, nonlabored ventilation, respiratory function stable and patient connected to nasal cannula oxygen Cardiovascular status: blood pressure returned to baseline and stable Postop Assessment: no apparent nausea or vomiting Anesthetic complications: no   No notable events documented.  Last Vitals:  Vitals:   11/21/22 1624 11/21/22 1630  BP: (!) 145/74 129/80  Pulse: 76 66  Resp: 16 10  Temp:  36.7 C  SpO2: 100% 100%    Last Pain:  Vitals:   11/21/22 1630  TempSrc:   PainSc: 3                  Mariann Barter

## 2022-11-21 NOTE — H&P (Signed)
Office Visit Report     11/18/2022   --------------------------------------------------------------------------------   [name]  MRN: [id number]  DOB: 03-14-1960, 62 year old Female  SSN: [id number]   PRIMARY CARE:  Lucius Conn, MD  PRIMARY CARE FAX:  [phone]  REFERRING:  Lucius Conn, MD  PROVIDER:  Heloise Purpura, M.D.  TREATING:  Bartholomew Crews, NP  LOCATION:  [facility] [phone]     --------------------------------------------------------------------------------   CC/HPI: Judy Jimenez follows up today for further evaluation of her recurrent stone disease. I had last seen her around 2014. She had a 24-hour urine in 2010 that indicated low urine volume. She then followed up as needed but developed a stone event in August 2022 which she subsequently passed last September. She states that she may have passed a few stones since then as well. Currently, she denies any flank pain, hematuria, or other complaints. She follows up today with a KUB x-ray.   11/18/2022: Judy Jimenez is a 61 year old female who presents today with concerns of suprapubic pressure and discomfort that radiates into the right side. She reports that starting in July she was having some right-sided flank pain this would come and go but was not severe. She then passed a stone a few weeks later. The pain stopped but over the last month and a half she has been treated for 2 UTIs with negative urine cultures. She states she never fully got better while taking antibiotics. She continues to have intermittent hematuria, dysuria, and bladder pressure. She denies fevers and chills. Phenazopyridine helps with the pain. She has a history of kidney stones.     ALLERGIES: Sulfa Drugs    MEDICATIONS: Aspirin 81 MG TABS Oral  Caltrate 600+D Plus TABS Oral  D3-2000 50 mcg (2,000 unit) capsule Oral  Estradiol 2Mg   Loestrin 24 Fe 1-20 MG-MCG TABS Oral  Multivitamins With Minerals  7.5 mg iron-400 mcg tablet Oral  Progesterone 200 mg capsule  Sertraline Hcl 50 mg tablet  Tamsulosin HCl - 0.4 MG Oral Capsule 1 Oral Daily     GU PSH: D&C Non-OB - 2014       PSH Notes: Dilation And Curettage (1999) (2001), Appendectomy (1601)   NON-GU PSH: Appendectomy - 2010     GU PMH: Renal calculus - 10/08/2021, - 2022, Nephrolithiasis, - 2014 Ureteral calculus - 2022, Calculus of ureter, - 2014      PMH Notes:   1) Urolithiasis: She has a long standing history of recurrent urolithiasis.   May 2010: 24 hr urine - low urine volume  Sep 2022: Passed a stone spontaneously   NON-GU PMH: Cardiac murmur, unspecified, Murmurs - 2014 Hypercholesterolemia    FAMILY HISTORY: 3 daughters - Daughter Alzheimer's Disease - Mother Death In The Family Father - Father Diabetes - Father Family Health Status - Mother's Age - Runs In Family Family Health Status Number - Runs In Family Father Deceased At [employer] ___ - Mother heart failure - Father Parkinson's Disease - Mother prostate cancer in father - Grandfather renal failure - Father   SOCIAL HISTORY: Marital Status: Married Preferred Language: English; Ethnicity: Not Hispanic Or Latino; Race: White Current Smoking Status: Patient has never smoked.   Tobacco Use Assessment Completed: Used Tobacco in last 30 days? Has never drank.  Drinks 1 caffeinated drink per day.     Notes: Never A Smoker, Occupation:, Caffeine Use, Marital History - Currently Married, Tobacco Use, Alcohol Use   REVIEW OF  SYSTEMS:    GU Review Female:   Patient reports frequent urination, hard to postpone urination, and burning /pain with urination. Patient denies get up at night to urinate, leakage of urine, stream starts and stops, trouble starting your stream, have to strain to urinate, and being pregnant.  Gastrointestinal (Upper):   Patient denies nausea, vomiting, and indigestion/ heartburn.  Gastrointestinal (Lower):   Patient denies diarrhea and  constipation.  Constitutional:   Patient denies fever, night sweats, weight loss, and fatigue.  Musculoskeletal:   Patient denies back pain and joint pain.  Neurological:   Patient denies headaches and dizziness.  Psychologic:   Patient denies depression and anxiety.   VITAL SIGNS:      11/18/2022 10:49 AM  Weight 129 lb / 58.51 kg  Height 61 in / 154.94 cm  BP 136/79 mmHg  Pulse 91 /min  BMI 24.4 kg/m   MULTI-SYSTEM PHYSICAL EXAMINATION:    Constitutional: Well-nourished. No physical deformities. Normally developed. Good grooming.  Respiratory: No labored breathing, no use of accessory muscles.   Cardiovascular: Normal temperature, normal extremity pulses, no swelling, no varicosities.  Skin: No paleness, no jaundice, no cyanosis. No lesion, no ulcer, no rash.  Neurologic / Psychiatric: Oriented to time, oriented to place, oriented to person. No depression, no anxiety, no agitation.  Gastrointestinal: No mass, no tenderness, no rigidity, non obese abdomen.     Complexity of Data:  Source Of History:  Patient  Records Review:   Previous Doctor Records, Previous Patient Records  Urine Test Review:   Urinalysis  X-Ray Review: C.T. Stone Protocol: Reviewed Films. Reviewed Report. Discussed With Patient.     11/18/22  Urinalysis  Urine Appearance Clear   Urine Color Yellow   Urine Glucose Neg mg/dL  Urine Bilirubin Neg mg/dL  Urine Ketones Neg mg/dL  Urine Specific Gravity 1.020   Urine Blood Neg ery/uL  Urine pH 5.5   Urine Protein Trace mg/dL  Urine Urobilinogen 1.0 mg/dL  Urine Nitrites Positive   Urine Leukocyte Esterase Trace leu/uL  Urine WBC/hpf 0 - 5/hpf   Urine RBC/hpf 0 - 2/hpf   Urine Epithelial Cells 0 - 5/hpf   Urine Bacteria Few (10-25/hpf)   Urine Mucous Present   Urine Yeast NS (Not Seen)   Urine Trichomonas Not Present   Urine Cystals NS (Not Seen)   Urine Casts NS (Not Seen)   Urine Sperm Not Present    PROCEDURES:         C.T. Urogram - [id number]       Patient confirmed No Neulasta OnPro Device.        PVR Ultrasound - [id number]  Scanned Volume: 0 cc         Urinalysis w/Scope Dipstick Dipstick Cont'd Micro  Color: Yellow Bilirubin: Neg mg/dL WBC/hpf: 0 - 5/hpf  Appearance: Clear Ketones: Neg mg/dL RBC/hpf: 0 - 2/hpf  Specific Gravity: 1.020 Blood: Neg ery/uL Bacteria: Few (10-25/hpf)  pH: 5.5 Protein: Trace mg/dL Cystals: NS (Not Seen)  Glucose: Neg mg/dL Urobilinogen: 1.0 mg/dL Casts: NS (Not Seen)    Nitrites: Positive Trichomonas: Not Present    Leukocyte Esterase: Trace leu/uL Mucous: Present      Epithelial Cells: 0 - 5/hpf      Yeast: NS (Not Seen)      Sperm: Not Present         Ketoralac 60mg  - [id number], [id number] The area was prepped and cleaned using sterile technique. A band aid was applied. The pt tolerated well. zero wasted  Qty: 60 Adm. By: Lissa Hoard McDougald  Unit: mg Lot No [id number]  Route: IM Exp. Date 11/18/2023  Freq: None Mfgr.:   Site: Right Buttock   ASSESSMENT:      ICD-10 Details  1 GU:   Renal calculus - N20.0   2   Ureteral calculus - N20.1    PLAN:            Medications New Meds: Tamsulosin Hcl 0.4 mg capsule 1 capsule PO Q HS   #30  1 Refill(s)  Hydrocodone-Acetaminophen 5 mg-325 mg tablet 1 tablet PO Q 6 H PRN pain related to kidney stone  #20  0 Refill(s)  Ondansetron Odt 8 mg tablet,disintegrating 1 tablet PO Q 6 H PRN nausea  #20  0 Refill(s)  Pharmacy Name:  [id number]  Address:  [address]   [place], [place] [id number]  Phone:  [phone]  Fax:  [phone]            Orders Labs CULTURE, URINE  X-Rays: C.T. Stone Protocol Without I.V. Contrast - Flank pain, Blood inurine, Hx of stones, passed a stone 2 months ago, negative urine culture  X-Ray Notes: History:  Hematuria: Yes/No  Patient to see MD after exam: Yes/No  Previous exam: CT / IVP/ US/ KUB/ None  When:  Where:  Diabetic: Yes/ No  BUN/ Creatinine:  Date of last BUN  Creatinine:  Weight in pounds:  Allergy- IV Contrast: Yes/ No  Conflicting diabetic meds: Yes/ No  Diabetic Meds:  Prior Authorization #Grayce Sessions #[id number] Valid 11/18/2022 - 12/17/2022            Schedule Return Visit/Planned Activity: Next Available Appointment - Schedule Surgery  Procedure: 11/18/2022 at [facility] - [phone] - Ketoralac 60mg  (Toradol per 15mg  (60 mg)) - [id number], [id number]          Document Letter(s):  Created for Patient: Clinical Summary         Notes:   1. Nephrolithiasis: Urine was sent for culture today. I advised CT imaging to rule out obstructing stone or distal ureteral stone. This does show a right sided stone near the right UVJ not causing much obstruction but there is some slight dilation to her right ureter. She has been dealing with frequent urination, dysuria and urgency as well as some suprapubic pain and pressure for over a month and would like to proceed with definitive stone intervention.   Urinalysis will be sent for precautionary culture today. Stone intervention was discussed in detail today. For ureteroscopy, the patient understands that there is a chance for a staged procedure. Patient also understands that there is risk for bleeding, infection, injury to surrounding organs, and general risks of anesthesia. The patient also understands the placement of a stent and the risks of stent placement including, risk for infection, the risk for pain, and the risk for injury.   She was given a dose of IM ketorolac as well as Zofran, tamsulosin and hydrocodone. She understands strict return precautions for fevers, chills, worsening symptoms.        Next Appointment:      Next Appointment: 12/05/2022 08:45 AM    Appointment Type: KUB    Location: [facility] [phone]    Provider: KUB KUB    Reason for Visit: 1 YR KUB      * Signed by Bartholomew Crews, NP on 11/18/22 at 12:46 PM (EDT)*

## 2022-11-21 NOTE — Anesthesia Preprocedure Evaluation (Signed)
Anesthesia Evaluation  Patient identified by MRN, date of birth, ID band Patient awake    Reviewed: Allergy & Precautions, NPO status , Patient's Chart, lab work & pertinent test results  Airway Mallampati: I  TM Distance: >3 FB Neck ROM: Full    Dental no notable dental hx.    Pulmonary neg pulmonary ROS   Pulmonary exam normal        Cardiovascular negative cardio ROS  Rhythm:Regular Rate:Normal     Neuro/Psych negative neurological ROS  negative psych ROS   GI/Hepatic negative GI ROS, Neg liver ROS,,,  Endo/Other  negative endocrine ROS    Renal/GU    Right ureteral vesical junction stone     Musculoskeletal negative musculoskeletal ROS (+)    Abdominal Normal abdominal exam  (+)   Peds  Hematology negative hematology ROS (+)   Anesthesia Other Findings   Reproductive/Obstetrics                             Anesthesia Physical Anesthesia Plan  ASA: 2  Anesthesia Plan: General   Post-op Pain Management:    Induction: Intravenous  PONV Risk Score and Plan: 3 and Ondansetron, Dexamethasone, Midazolam and Treatment may vary due to age or medical condition  Airway Management Planned: Mask and LMA  Additional Equipment: None  Intra-op Plan:   Post-operative Plan: Extubation in OR  Informed Consent: I have reviewed the patients History and Physical, chart, labs and discussed the procedure including the risks, benefits and alternatives for the proposed anesthesia with the patient or authorized representative who has indicated his/her understanding and acceptance.     Dental advisory given  Plan Discussed with: CRNA  Anesthesia Plan Comments:        Anesthesia Quick Evaluation

## 2022-11-21 NOTE — Anesthesia Procedure Notes (Signed)
Procedure Name: LMA Insertion Date/Time: 11/21/2022 3:43 PM  Performed by: Nathen May, CRNAPre-anesthesia Checklist: Patient identified, Emergency Drugs available, Suction available and Patient being monitored Patient Re-evaluated:Patient Re-evaluated prior to induction Oxygen Delivery Method: Circle System Utilized Preoxygenation: Pre-oxygenation with 100% oxygen Induction Type: IV induction Ventilation: Mask ventilation without difficulty LMA: LMA inserted LMA Size: 4.0 Number of attempts: 1 Airway Equipment and Method: Bite block Placement Confirmation: positive ETCO2 Tube secured with: Tape Dental Injury: Teeth and Oropharynx as per pre-operative assessment

## 2022-11-21 NOTE — Interval H&P Note (Signed)
History and Physical Interval Note:  11/21/2022 2:50 PM  Judy Jimenez  has presented today for surgery, with the diagnosis of RIGHT URETERAL VESSICAL JUNCTION STONE.  The various methods of treatment have been discussed with the patient and family. After consideration of risks, benefits and other options for treatment, the patient has consented to  Procedure(s): CYSTOSCOPY WITH RETROGRADE PYELOGRAM, URETEROSCOPY AND STENT PLACEMENT (Right) as a surgical intervention.  The patient's history has been reviewed, patient examined, no change in status, stable for surgery.  I have reviewed the patient's chart and labs.  Questions were answered to the patient's satisfaction.     [employer]

## 2022-11-21 NOTE — Op Note (Signed)
Preoperative diagnosis: Right ureteral calculus  Postoperative diagnosis: Right ureteral calculus  Procedure:  Cystoscopy Right ureteroscopy and stone removal Ureteroscopic laser lithotripsy Right ureteral stent placement (6 x 24 with string)  Right retrograde pyelography with interpretation  Surgeon: Moody Bruins. M.D.  Anesthesia: General  Complications: None  Intraoperative findings: Right retrograde pyelography demonstrated a filling defect within the distal ureter consistent with the patient's known calculus without other abnormalities.  EBL: Minimal  Specimens: Right ureteral calculus  Disposition of specimens: [facility] for stone analysis  Indication: Judy Jimenez is a 62 y.o. year old patient with urolithiasis and a symptomatic right distal ureteral stone. After reviewing the management options for treatment, the patient elected to proceed with the above surgical procedure(s). We have discussed the potential benefits and risks of the procedure, side effects of the proposed treatment, the likelihood of the patient achieving the goals of the procedure, and any potential problems that might occur during the procedure or recuperation. Informed consent has been obtained.  Description of procedure:  The patient was taken to the operating room and general anesthesia was induced.  The patient was placed in the dorsal lithotomy position, prepped and draped in the usual sterile fashion, and preoperative antibiotics were administered. A preoperative time-out was performed.   Cystourethroscopy was performed.  The patient's urethra was examined and was normal. The bladder was then systematically examined in its entirety. There was no evidence for any bladder tumors, stones, or other mucosal pathology.    Attention then turned to the right ureteral orifice and a ureteral catheter was used to intubate the ureteral orifice.  Omnipaque contrast was injected  through the ureteral catheter and a retrograde pyelogram was performed with findings as dictated above.  A 0.38 sensor guidewire was then advanced up the right ureter into the renal pelvis under fluoroscopic guidance. The 6 Fr semirigid ureteroscope was then advanced into the ureter next to the guidewire and the calculus was identified.   The stone was then fragmented with the 365 micron holmium laser fiber on a setting of 0.6 J and frequency of 6 Hz.   All stones were then removed from the ureter with a zero tip nitinol basket.  Reinspection of the ureter revealed no remaining visible stones or fragments.   The wire was then backloaded through the cystoscope and a ureteral stent was advance over the wire using Seldinger technique.  The stent was positioned appropriately under fluoroscopic and cystoscopic guidance.  The wire was then removed with an adequate stent curl noted in the renal pelvis as well as in the bladder.  The bladder was then emptied and the procedure ended.  The patient appeared to tolerate the procedure well and without complications.  The patient was able to be awakened and transferred to the recovery unit in satisfactory condition.

## 2022-11-21 NOTE — Discharge Instructions (Addendum)
You may see some blood in the urine and may have some burning with urination for 48-72 hours. You also may notice that you have to urinate more frequently or urgently after your procedure which is normal.  You should call should you develop an inability urinate, fever > 101, persistent nausea and vomiting that prevents you from eating or drinking to stay hydrated.  If you have a stent, you will likely urinate more frequently and urgently until the stent is removed and you may experience some discomfort/pain in the lower abdomen and flank especially when urinating. You may take pain medication prescribed to you if needed for pain. You may also intermittently have blood in the urine until the stent is removed. If you have a catheter, you will be taught how to take care of the catheter by the nursing staff prior to discharge from the hospital.  You may periodically feel a strong urge to void with the catheter in place.  This is a bladder spasm and most often can occur when having a bowel movement or moving around. It is typically self-limited and usually will stop after a few minutes.  You may use some Vaseline or Neosporin around the tip of the catheter to reduce friction at the tip of the penis. You may also see some blood in the urine.  A very small amount of blood can make the urine look quite red.  As long as the catheter is draining well, there usually is not a problem.  However, if the catheter is not draining well and is bloody, you should call the office [phone]) to notify us. You may remove your stent on Tuesday morning.  Simply pull the string that is taped to your body and the stent will easily come out.  This may be best done in the shower as some urine may come out with the stent.  Usually you will feel relief once the stent is removed, but occasionally patients can develop pain due to residual swelling of the ureter that may temporarily obstruct the kidney.  This can be managed by taking pain  medication and it will typically resolve with time.  Please do not hesitate to call if you have pain that is not controlled with your pain medication or does not improved within 24-48 hours.

## 2022-11-22 ENCOUNTER — Encounter (HOSPITAL_COMMUNITY): Payer: Self-pay | Admitting: Urology

## 2023-04-16 ENCOUNTER — Ambulatory Visit (INDEPENDENT_AMBULATORY_CARE_PROVIDER_SITE_OTHER): Payer: No Typology Code available for payment source

## 2023-04-17 ENCOUNTER — Ambulatory Visit (INDEPENDENT_AMBULATORY_CARE_PROVIDER_SITE_OTHER): Payer: No Typology Code available for payment source

## 2023-05-19 IMAGING — CT CT ABD-PELV W/ CM
2 of 5 series · 16 of 46 positions shown, 18 images · IV contrast (APPLIED)
Comparison: CT abdomen pelvis dated 03/25/2012.

CLINICAL DATA: Concern for bowel obstruction.

EXAM:
CT ABDOMEN AND PELVIS WITH CONTRAST
TECHNIQUE: Multidetector CT imaging of the abdomen and pelvis was performed
using the standard protocol following bolus administration of
intravenous contrast.

[Series 3: abdomen 5.0 · axial · 0.93mm/px · z∈[+773,+1118]mm · 13 of 81 slices shown, 15 images]
[im 6/81  soft-tissue]
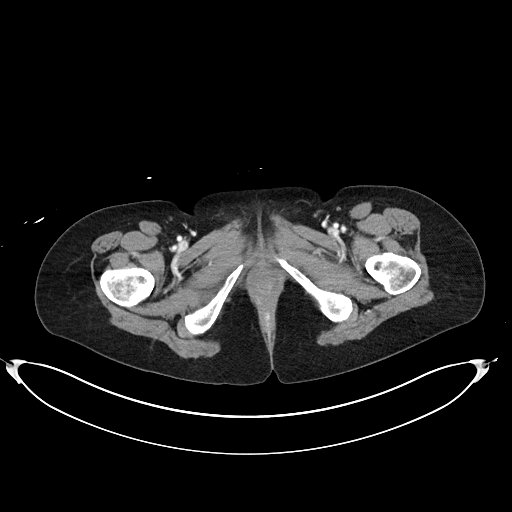
[im 6/81  bone]
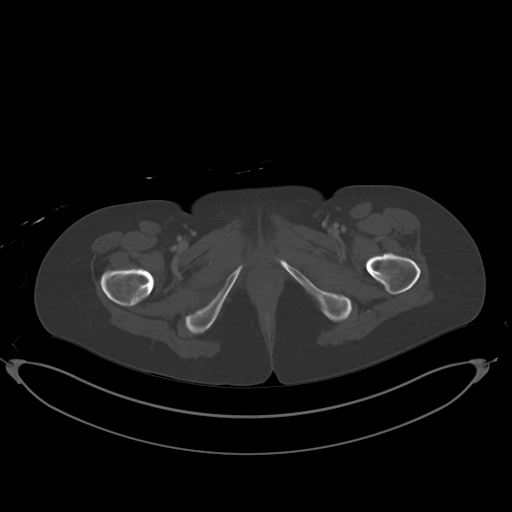
[im 12/81  soft-tissue]
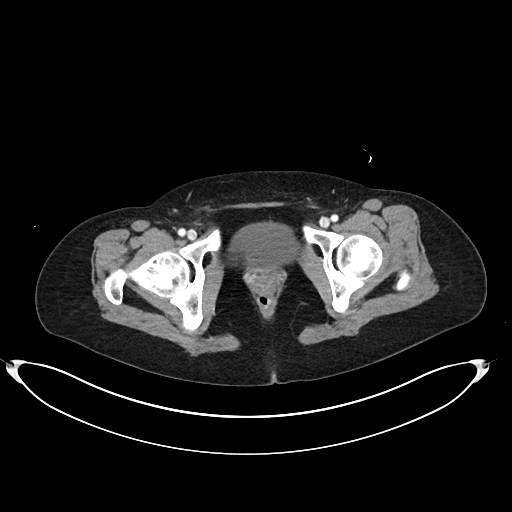
[im 18/81  soft-tissue]
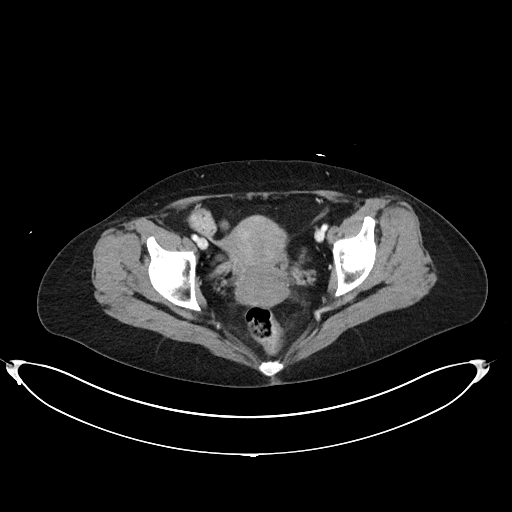
[im 23/81  soft-tissue]
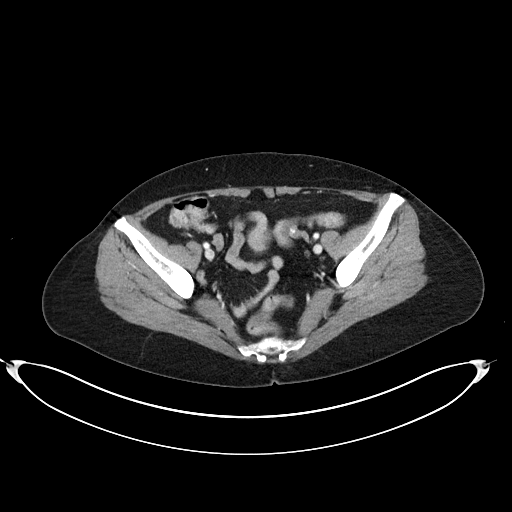
[im 29/81  soft-tissue]
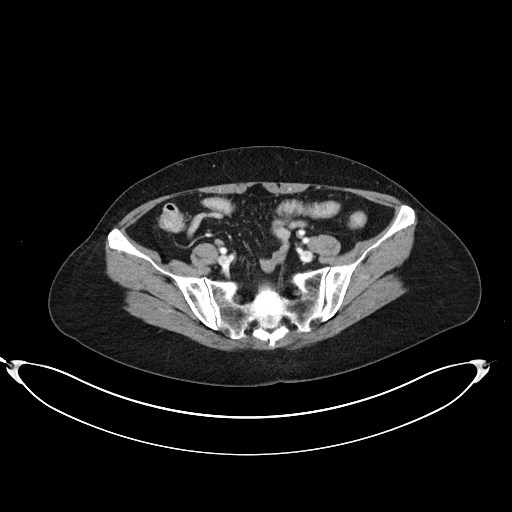
[im 35/81  soft-tissue]
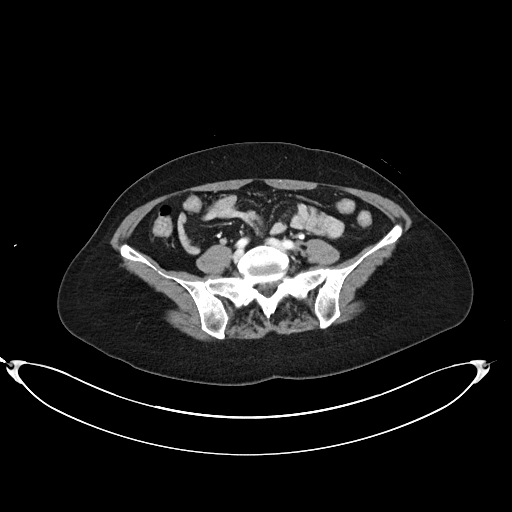
[im 41/81  soft-tissue]
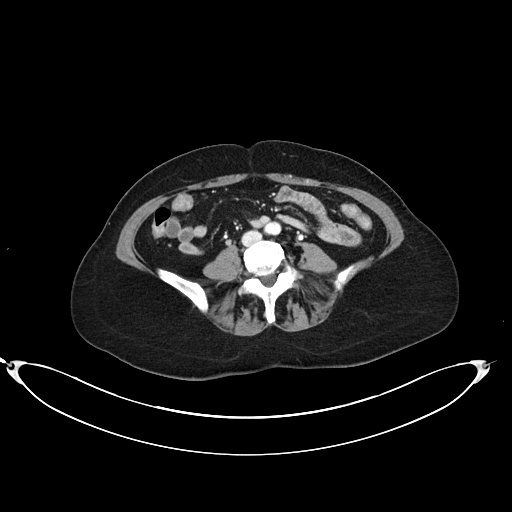
[im 46/81  soft-tissue]
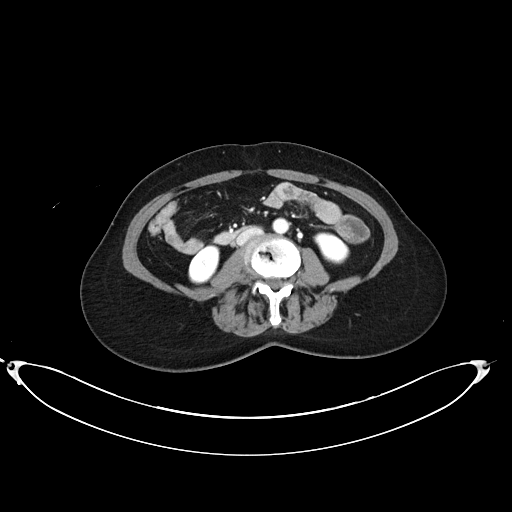
[im 52/81  soft-tissue]
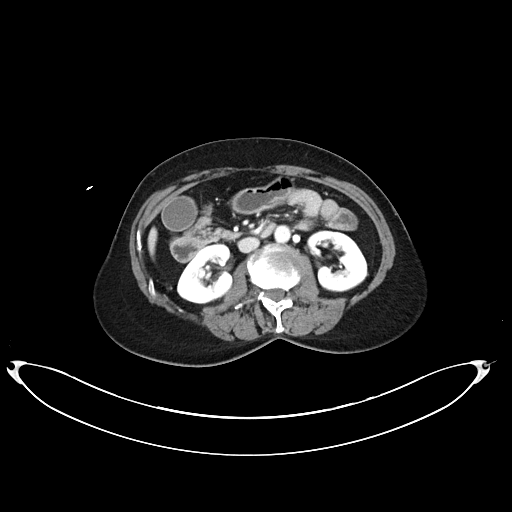
[im 52/81  bone]
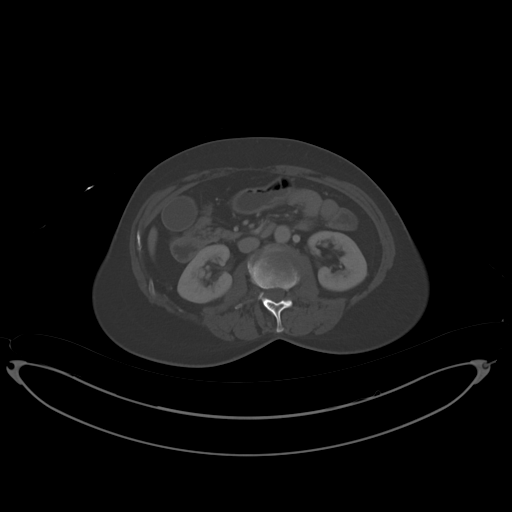
[im 58/81  soft-tissue]
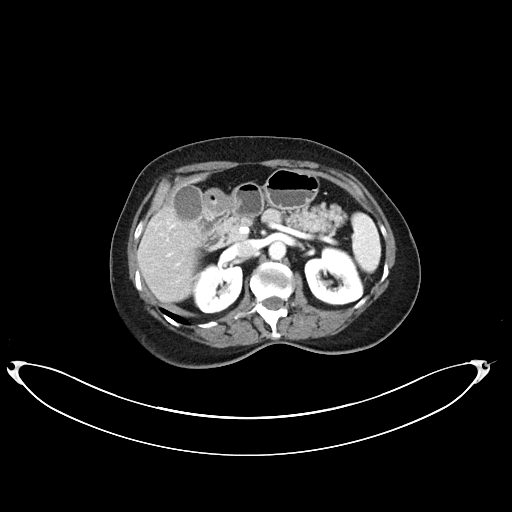
[im 63/81  soft-tissue]
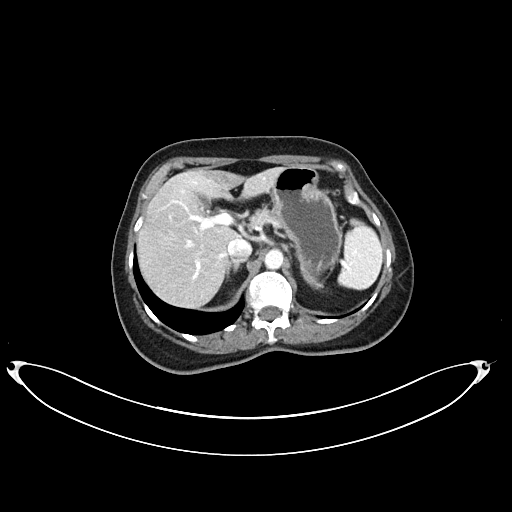
[im 69/81  soft-tissue]
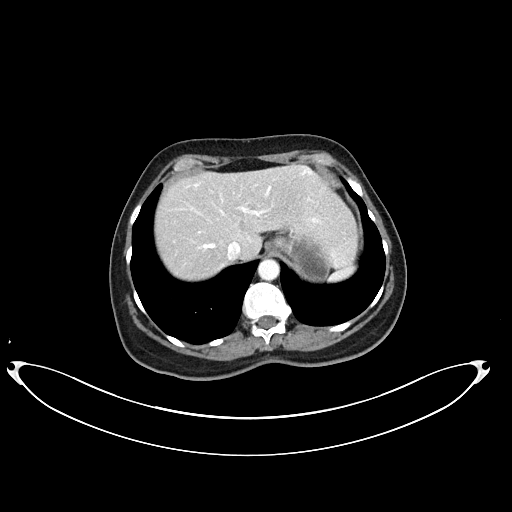
[im 75/81  soft-tissue]
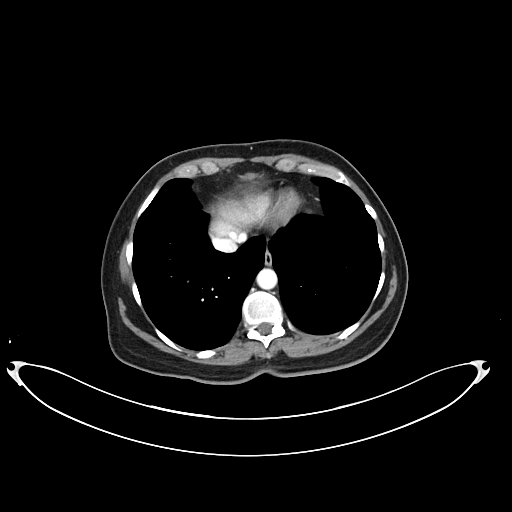

[Series 6: abdomen 3.0 mpr cor · coronal · 0.82mm/px · 3 of 80 slices shown]
[im 27/80  soft-tissue]
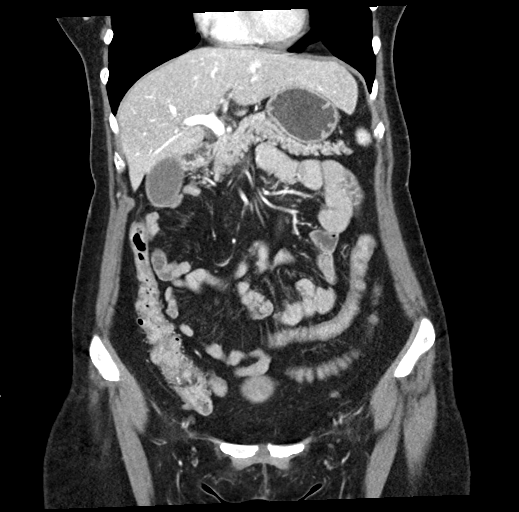
[im 36/80  soft-tissue]
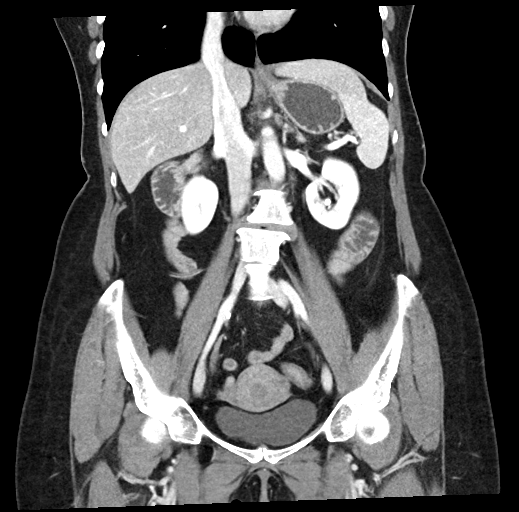
[im 44/80  soft-tissue]
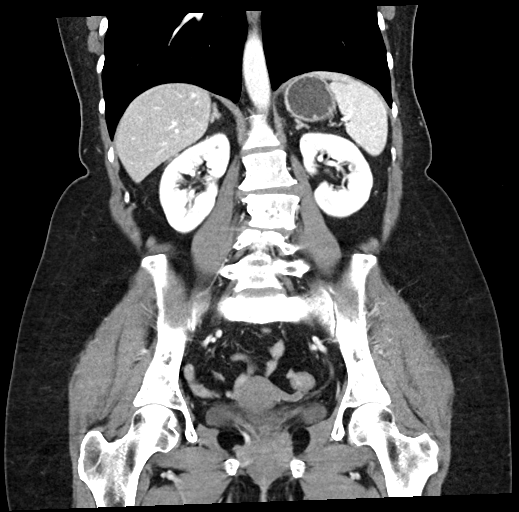

[16 of 46 positions shown; findings below may reference images not displayed]

RADIATION DOSE REDUCTION: This exam was performed according to the
departmental dose-optimization program which includes automated
exposure control, adjustment of the mA and/or kV according to
patient size and/or use of iterative reconstruction technique.

CONTRAST:  100mL OMNIPAQUE IOHEXOL 300 MG/ML  SOLN
FINDINGS: Lower chest: The visualized lung bases are clear.

No intra-abdominal free air or free fluid.

Hepatobiliary: No focal liver abnormality is seen. No gallstones,
gallbladder wall thickening, or biliary dilatation.

Pancreas: Unremarkable. No pancreatic ductal dilatation or
surrounding inflammatory changes.

Spleen: Normal in size without focal abnormality.

Adrenals/Urinary Tract: Several nonobstructing bilateral renal
calculi measure up to 5 mm in the inferior pole of the left kidney.
No hydronephrosis. There is symmetric enhancement and excretion of
contrast by both kidneys. The visualized ureters and urinary bladder
appear unremarkable.

Stomach/Bowel: Several small scattered colonic diverticula without
active inflammatory changes. Mild diffuse thickened appearance of
the colon, likely related to underdistention. Mild colitis is less
likely. Clinical correlation is recommended. There is no bowel
obstruction. No evidence of acute appendicitis.

Vascular/Lymphatic: The abdominal aorta and IVC are unremarkable on
this noncontrast CT. No portal venous gas. There is no adenopathy.

Reproductive: The uterus is anteverted. The endometrium appears
somewhat thickened measuring approximately 14 mm. This is however
not evaluated by CT. Further evaluation with ultrasound recommended.
No adnexal masses.

Other: Small fat containing umbilical hernia.

Musculoskeletal: Degenerative changes of the spine. Partial L4-L5
fusion. No acute osseous pathology. Scoliosis.
IMPRESSION: 1. Underdistention of the colon versus less likely mild colitis.
Clinical correlation is recommended no bowel obstruction.
2. Bilateral nonobstructing renal calculi. No hydronephrosis.
3. Thickened endometrium. Further evaluation with ultrasound
recommended.

## 2023-05-28 ENCOUNTER — Ambulatory Visit (INDEPENDENT_AMBULATORY_CARE_PROVIDER_SITE_OTHER): Payer: No Typology Code available for payment source | Admitting: Otolaryngology

## 2023-05-28 ENCOUNTER — Encounter (INDEPENDENT_AMBULATORY_CARE_PROVIDER_SITE_OTHER): Payer: Self-pay

## 2023-05-28 VITALS — BP 115/75 | HR 68 | Ht 60.0 in | Wt 128.0 lb

## 2023-05-28 DIAGNOSIS — H9313 Tinnitus, bilateral: Secondary | ICD-10-CM

## 2023-05-28 DIAGNOSIS — R42 Dizziness and giddiness: Secondary | ICD-10-CM

## 2023-05-28 DIAGNOSIS — H903 Sensorineural hearing loss, bilateral: Secondary | ICD-10-CM | POA: Diagnosis not present

## 2023-05-30 DIAGNOSIS — H9313 Tinnitus, bilateral: Secondary | ICD-10-CM | POA: Insufficient documentation

## 2023-05-30 DIAGNOSIS — H903 Sensorineural hearing loss, bilateral: Secondary | ICD-10-CM | POA: Insufficient documentation

## 2023-05-30 DIAGNOSIS — R42 Dizziness and giddiness: Secondary | ICD-10-CM | POA: Insufficient documentation

## 2023-05-30 NOTE — Progress Notes (Signed)
 Patient ID: Judy Jimenez, female   DOB: Apr 20, 1960, 63 y.o.   MRN: [id number]  Follow-up: Recurrent dizziness, hearing loss, tinnitus  HPI: The patient is a 63 year old female who returns today for her follow-up evaluation.  The patient was previously seen for recurrent dizziness, bilateral sensorineural hearing loss, and bilateral tinnitus.  At her last visit in June 2024, she was noted to have bilateral high-frequency sensorineural hearing loss.  The patient uses bilateral hearing aids.  She returns today reporting no significant dizziness since her last visit.  She still has bilateral high-pitched tinnitus.  The tinnitus is nonpulsatile.  She denies any change in her hearing.  Currently she denies any otalgia, otorrhea, or vertigo.  Exam: General: Communicates without difficulty, well nourished, no acute distress. Head: Normocephalic, no evidence injury, no tenderness, facial buttresses intact without stepoff. Face/sinus: No tenderness to palpation and percussion. Facial movement is normal and symmetric. Eyes: PERRL, EOMI. No scleral icterus, conjunctivae clear. Neuro: CN II exam reveals vision grossly intact.  No nystagmus at any point of gaze. Ears: Auricles well formed without lesions.  Ear canals are intact without mass or lesion.  No erythema or edema is appreciated.  The TMs are intact without fluid. Nose: External evaluation reveals normal support and skin without lesions.  Dorsum is intact.  Anterior rhinoscopy reveals normal mucosa over anterior aspect of inferior turbinates and intact septum.  No purulence noted. Oral:  Oral cavity and oropharynx are intact, symmetric, without erythema or edema.  Mucosa is moist without lesions. Neck: Full range of motion without pain.  There is no significant lymphadenopathy.  No masses palpable.  Thyroid bed within normal limits to palpation.  Parotid glands and submandibular glands equal bilaterally without mass.  Trachea is midline. Neuro:  CN 2-12  grossly intact.   Assessment: 1.  Subjectively stable bilateral high-frequency sensorineural hearing loss. 2.  Her tinnitus is likely a direct result of her hearing loss. 3.  Her dizziness has resolved.  She has not had any vertigo since her last visit.  Plan: 1.  The physical exam findings are reviewed with the patient. 2.  Continue the use of her hearing aids. 3.  The strategies to cope with tinnitus, including the use of masker, hearing aids, tinnitus retraining therapy, and avoidance of caffeine and alcohol are discussed.  4.  The patient will return for reevaluation in 1 year.

## 2023-06-10 DIAGNOSIS — D0470 Carcinoma in situ of skin of unspecified lower limb, including hip: Secondary | ICD-10-CM | POA: Diagnosis not present

## 2023-06-10 DIAGNOSIS — L821 Other seborrheic keratosis: Secondary | ICD-10-CM | POA: Diagnosis not present

## 2023-06-10 DIAGNOSIS — L570 Actinic keratosis: Secondary | ICD-10-CM | POA: Diagnosis not present

## 2023-06-10 DIAGNOSIS — D0472 Carcinoma in situ of skin of left lower limb, including hip: Secondary | ICD-10-CM | POA: Diagnosis not present

## 2023-06-10 DIAGNOSIS — L438 Other lichen planus: Secondary | ICD-10-CM | POA: Diagnosis not present

## 2023-06-10 DIAGNOSIS — L814 Other melanin hyperpigmentation: Secondary | ICD-10-CM | POA: Diagnosis not present

## 2023-07-09 DIAGNOSIS — D0472 Carcinoma in situ of skin of left lower limb, including hip: Secondary | ICD-10-CM | POA: Diagnosis not present

## 2023-07-15 DIAGNOSIS — M8580 Other specified disorders of bone density and structure, unspecified site: Secondary | ICD-10-CM | POA: Diagnosis not present

## 2023-07-15 DIAGNOSIS — E785 Hyperlipidemia, unspecified: Secondary | ICD-10-CM | POA: Diagnosis not present

## 2023-07-22 DIAGNOSIS — Z1331 Encounter for screening for depression: Secondary | ICD-10-CM | POA: Diagnosis not present

## 2023-07-22 DIAGNOSIS — R7301 Impaired fasting glucose: Secondary | ICD-10-CM | POA: Diagnosis not present

## 2023-07-22 DIAGNOSIS — Z1339 Encounter for screening examination for other mental health and behavioral disorders: Secondary | ICD-10-CM | POA: Diagnosis not present

## 2023-07-22 DIAGNOSIS — Z0000 Encounter for general adult medical examination without abnormal findings: Secondary | ICD-10-CM | POA: Diagnosis not present

## 2023-07-22 DIAGNOSIS — M8580 Other specified disorders of bone density and structure, unspecified site: Secondary | ICD-10-CM | POA: Diagnosis not present

## 2023-07-22 DIAGNOSIS — R82998 Other abnormal findings in urine: Secondary | ICD-10-CM | POA: Diagnosis not present

## 2023-07-31 ENCOUNTER — Other Ambulatory Visit: Payer: Self-pay | Admitting: Internal Medicine

## 2023-07-31 DIAGNOSIS — E785 Hyperlipidemia, unspecified: Secondary | ICD-10-CM

## 2023-08-28 ENCOUNTER — Ambulatory Visit
Admission: RE | Admit: 2023-08-28 | Discharge: 2023-08-28 | Disposition: A | Discharge status: Home / Self Care | Source: Ambulatory Visit | Attending: Internal Medicine | Admitting: Internal Medicine

## 2023-08-28 DIAGNOSIS — E785 Hyperlipidemia, unspecified: Secondary | ICD-10-CM

## 2023-09-24 DIAGNOSIS — Z13820 Encounter for screening for osteoporosis: Secondary | ICD-10-CM | POA: Diagnosis not present

## 2023-10-23 DIAGNOSIS — H11051 Peripheral pterygium, progressive, right eye: Secondary | ICD-10-CM | POA: Diagnosis not present

## 2023-10-23 DIAGNOSIS — H18453 Nodular corneal degeneration, bilateral: Secondary | ICD-10-CM | POA: Diagnosis not present

## 2023-10-27 DIAGNOSIS — Z1211 Encounter for screening for malignant neoplasm of colon: Secondary | ICD-10-CM | POA: Diagnosis not present

## 2023-10-27 DIAGNOSIS — Z800 Family history of malignant neoplasm of digestive organs: Secondary | ICD-10-CM | POA: Diagnosis not present

## 2023-11-13 DIAGNOSIS — K644 Residual hemorrhoidal skin tags: Secondary | ICD-10-CM | POA: Diagnosis not present

## 2023-11-13 DIAGNOSIS — K5730 Diverticulosis of large intestine without perforation or abscess without bleeding: Secondary | ICD-10-CM | POA: Diagnosis not present

## 2023-11-13 DIAGNOSIS — K635 Polyp of colon: Secondary | ICD-10-CM | POA: Diagnosis not present

## 2023-11-13 DIAGNOSIS — Z1211 Encounter for screening for malignant neoplasm of colon: Secondary | ICD-10-CM | POA: Diagnosis not present

## 2023-11-13 DIAGNOSIS — Z800 Family history of malignant neoplasm of digestive organs: Secondary | ICD-10-CM | POA: Diagnosis not present

## 2023-12-02 DIAGNOSIS — H578A1 Foreign body sensation, right eye: Secondary | ICD-10-CM | POA: Diagnosis not present

## 2023-12-02 DIAGNOSIS — H11051 Peripheral pterygium, progressive, right eye: Secondary | ICD-10-CM | POA: Diagnosis not present

## 2023-12-02 DIAGNOSIS — Z882 Allergy status to sulfonamides status: Secondary | ICD-10-CM | POA: Diagnosis not present

## 2023-12-02 DIAGNOSIS — H52201 Unspecified astigmatism, right eye: Secondary | ICD-10-CM | POA: Diagnosis not present

## 2023-12-02 DIAGNOSIS — H52221 Regular astigmatism, right eye: Secondary | ICD-10-CM | POA: Diagnosis not present

## 2023-12-02 DIAGNOSIS — H18451 Nodular corneal degeneration, right eye: Secondary | ICD-10-CM | POA: Diagnosis not present

## 2023-12-02 DIAGNOSIS — Z9889 Other specified postprocedural states: Secondary | ICD-10-CM | POA: Diagnosis not present

## 2023-12-02 DIAGNOSIS — Z9049 Acquired absence of other specified parts of digestive tract: Secondary | ICD-10-CM | POA: Diagnosis not present

## 2023-12-11 DIAGNOSIS — N200 Calculus of kidney: Secondary | ICD-10-CM | POA: Diagnosis not present

## 2023-12-21 ENCOUNTER — Other Ambulatory Visit: Payer: Self-pay | Admitting: Internal Medicine

## 2023-12-21 DIAGNOSIS — R918 Other nonspecific abnormal finding of lung field: Secondary | ICD-10-CM

## 2023-12-25 ENCOUNTER — Other Ambulatory Visit

## 2024-02-09 ENCOUNTER — Ambulatory Visit
Admission: RE | Admit: 2024-02-09 | Discharge: 2024-02-09 | Disposition: A | Discharge status: Home / Self Care | Source: Ambulatory Visit | Attending: Internal Medicine | Admitting: Internal Medicine

## 2024-02-09 DIAGNOSIS — R918 Other nonspecific abnormal finding of lung field: Secondary | ICD-10-CM

## 2024-02-12 ENCOUNTER — Other Ambulatory Visit

## 2024-03-16 ENCOUNTER — Other Ambulatory Visit: Payer: Self-pay | Admitting: Obstetrics and Gynecology

## 2024-03-16 DIAGNOSIS — R928 Other abnormal and inconclusive findings on diagnostic imaging of breast: Secondary | ICD-10-CM

## 2024-03-25 ENCOUNTER — Ambulatory Visit: Admission: RE | Admit: 2024-03-25 | Source: Ambulatory Visit

## 2024-03-25 DIAGNOSIS — R928 Other abnormal and inconclusive findings on diagnostic imaging of breast: Secondary | ICD-10-CM
